# Patient Record
Sex: Female | Born: 1990 | Race: Black or African American | Hispanic: No | Marital: Single | Smoking: Never smoker
Health system: Southern US, Community
[De-identification: ages and names within clinical notes are randomized; demographics above are authoritative.]

---

## 2015-10-15 ENCOUNTER — Emergency Department (HOSPITAL_BASED_OUTPATIENT_CLINIC_OR_DEPARTMENT_OTHER)
Admission: EM | Admit: 2015-10-15 | Discharge: 2015-10-15 | Disposition: A | Discharge status: Home / Self Care | Payer: Self-pay | Attending: Emergency Medicine | Admitting: Emergency Medicine

## 2015-10-15 ENCOUNTER — Encounter (HOSPITAL_BASED_OUTPATIENT_CLINIC_OR_DEPARTMENT_OTHER): Payer: Self-pay | Admitting: *Deleted

## 2015-10-15 DIAGNOSIS — N760 Acute vaginitis: Secondary | ICD-10-CM | POA: Insufficient documentation

## 2015-10-15 DIAGNOSIS — B9689 Other specified bacterial agents as the cause of diseases classified elsewhere: Secondary | ICD-10-CM

## 2015-10-15 DIAGNOSIS — Z3202 Encounter for pregnancy test, result negative: Secondary | ICD-10-CM | POA: Insufficient documentation

## 2015-10-15 LAB — WET PREP, GENITAL
Sperm: NONE SEEN
Trich, Wet Prep: NONE SEEN
Yeast Wet Prep HPF POC: NONE SEEN

## 2015-10-15 LAB — URINALYSIS, ROUTINE W REFLEX MICROSCOPIC
Bilirubin Urine: NEGATIVE
GLUCOSE, UA: NEGATIVE mg/dL
HGB URINE DIPSTICK: NEGATIVE
KETONES UR: NEGATIVE mg/dL
LEUKOCYTES UA: NEGATIVE
Nitrite: NEGATIVE
PROTEIN: NEGATIVE mg/dL
Specific Gravity, Urine: 1.024 (ref 1.005–1.030)
pH: 7 (ref 5.0–8.0)

## 2015-10-15 LAB — PREGNANCY, URINE: PREG TEST UR: NEGATIVE

## 2015-10-15 MED ORDER — METRONIDAZOLE 500 MG PO TABS: 500.0000 mg | ORAL_TABLET | Freq: Two times a day (BID) | ORAL | Status: DC

## 2015-10-15 NOTE — ED Notes (Signed)
Pt reports vaginal discharge and lower abdominal pain since wednesday

## 2015-10-15 NOTE — Discharge Instructions (Signed)

## 2015-10-15 NOTE — ED Provider Notes (Signed)
CSN: [id number]     Arrival date & time 10/15/15  1255 History   First MD Initiated Contact with Patient 10/15/15 1357     Chief Complaint  Patient presents with  . Vaginal Discharge     (Consider location/radiation/quality/duration/timing/severity/associated sxs/prior Treatment) HPI   25 year old female presents complaining of abdominal pain and vaginal discharge. Patient reports for the past 4 days she has noticed stronger vaginal odor and discharge with some mild low abdominal cramping. Pain is primarily to the left side of her abdomen described as a crampy and occasional sharp sensation mild to moderate in severity. She denies having any associated fever, back pain, dysuria, hematuria, vaginal bleeding or rash. Denies any pain with sexual activities, denies any recent new sexual partners and no prior history of STD but she did have a remote history of bacterial vaginosis.   History reviewed. No pertinent past medical history. History reviewed. No pertinent past surgical history. No family history on file. Social History  Substance Use Topics  . Smoking status: Never Smoker   . Smokeless tobacco: Never Used  . Alcohol Use: No   OB History    No data available     Review of Systems  Constitutional: Negative for fever.  Genitourinary: Positive for vaginal discharge. Negative for flank pain and vaginal bleeding.  All other systems reviewed and are negative.     Allergies  Review of patient's allergies indicates no known allergies.  Home Medications   Prior to Admission medications   Not on File   BP 112/73 mmHg  Pulse 66  Temp(Src) 98.2 F (36.8 C) (Oral)  Resp 18  Ht  (1.727 m)  Wt 77.021 kg  BMI 25.82 kg/m2  SpO2 100%  LMP 09/30/2015 Physical Exam  Constitutional: She appears well-developed and well-nourished. No distress.  HENT:  Head: Atraumatic.  Eyes: Conjunctivae are normal.  Neck: Neck supple.  Cardiovascular: Normal rate and regular rhythm.    Pulmonary/Chest: Effort normal and breath sounds normal.  Abdominal: Soft. Bowel sounds are normal. She exhibits no distension. There is no tenderness.  Genitourinary:  Chaperone present during exam.  no inguinal lymphadenopathy or inguinal hernia noted. Normal external genitalia. No pain with speculum insertion. moderate amount of white discharge noted in vaginal vault. Close cervical os with mild dystrophic skin changes to the T-zone. On bimanual examination patient has no adnexal tenderness or cervical motion tenderness.   Neurological: She is alert.  Skin: No rash noted.  Psychiatric: She has a normal mood and affect.  Nursing note and vitals reviewed.   ED Course  Procedures (including critical care time) Labs Review Labs Reviewed  WET PREP, GENITAL - Abnormal; Notable for the following:    Clue Cells Wet Prep HPF POC PRESENT (*)    WBC, Wet Prep HPF POC MANY (*)    All other components within normal limits  URINALYSIS, ROUTINE W REFLEX MICROSCOPIC (NOT AT [facility]) - Abnormal; Notable for the following:    APPearance CLOUDY (*)    All other components within normal limits  PREGNANCY, URINE  RPR  HIV ANTIBODY (ROUTINE TESTING)  GC/CHLAMYDIA PROBE AMP () NOT AT [facility]    Imaging Review No results found.  I have personally reviewed and evaluated these images and lab results as part of my medical decision-making.   EKG Interpretation None      MDM   Final diagnoses:  BV (bacterial vaginosis)    BP 107/66 mmHg  Pulse 68  Temp(Src) 98.2 F (36.8 C) (  Oral)  Resp 16  Ht  (1.727 m)  Wt 77.021 kg  BMI 25.82 kg/m2  SpO2 98%  LMP 09/30/2015   2:59 PM patient here with complaints of vaginal discharge, prior history of bacterial vaginosis. She does not have any pain on pelvic examination to suggest PID. Her urine pregnancy test is negative and her urine shows no signs of urinary tract infection.  3:58 PM Wet prep shows moderate clue cells as well as maybe  WBC. Patient does have strong odor with vaginal discharge consistent with bacterial vaginosis. Her pregnancy test is negative. Her urine shows no signs of urinary tract infection. She will be treated for BV with Flagyl. I recommend avoid alcohol use while taking antibiotic as it can cause an adverse reaction.  Fayrene Helper, PA-C 10/15/15 1559  Glynn Octave, MD 10/15/15 [phone]

## 2015-10-16 LAB — HIV ANTIBODY (ROUTINE TESTING W REFLEX): HIV Screen 4th Generation wRfx: NONREACTIVE

## 2015-10-16 LAB — RPR: RPR Ser Ql: NONREACTIVE

## 2015-10-17 LAB — GC/CHLAMYDIA PROBE AMP (~~LOC~~) NOT AT ARMC
Chlamydia: NEGATIVE
NEISSERIA GONORRHEA: NEGATIVE

## 2017-07-07 ENCOUNTER — Encounter (HOSPITAL_BASED_OUTPATIENT_CLINIC_OR_DEPARTMENT_OTHER): Payer: Self-pay | Admitting: Emergency Medicine

## 2017-07-07 ENCOUNTER — Emergency Department (HOSPITAL_BASED_OUTPATIENT_CLINIC_OR_DEPARTMENT_OTHER)
Admission: EM | Admit: 2017-07-07 | Discharge: 2017-07-07 | Disposition: A | Discharge status: Home / Self Care | Payer: Self-pay | Attending: Emergency Medicine | Admitting: Emergency Medicine

## 2017-07-07 ENCOUNTER — Emergency Department (HOSPITAL_BASED_OUTPATIENT_CLINIC_OR_DEPARTMENT_OTHER): Payer: Self-pay

## 2017-07-07 DIAGNOSIS — R1032 Left lower quadrant pain: Secondary | ICD-10-CM | POA: Insufficient documentation

## 2017-07-07 DIAGNOSIS — N3000 Acute cystitis without hematuria: Secondary | ICD-10-CM | POA: Insufficient documentation

## 2017-07-07 LAB — URINALYSIS, ROUTINE W REFLEX MICROSCOPIC
BILIRUBIN URINE: NEGATIVE
Glucose, UA: NEGATIVE mg/dL
Ketones, ur: NEGATIVE mg/dL
Nitrite: NEGATIVE
PROTEIN: NEGATIVE mg/dL
Specific Gravity, Urine: 1.015 (ref 1.005–1.030)
pH: 8 (ref 5.0–8.0)

## 2017-07-07 LAB — WET PREP, GENITAL
Clue Cells Wet Prep HPF POC: NONE SEEN
SPERM: NONE SEEN
Trich, Wet Prep: NONE SEEN
Yeast Wet Prep HPF POC: NONE SEEN

## 2017-07-07 LAB — PREGNANCY, URINE: PREG TEST UR: NEGATIVE

## 2017-07-07 LAB — URINALYSIS, MICROSCOPIC (REFLEX)

## 2017-07-07 MED ORDER — CEPHALEXIN 500 MG PO CAPS: 500.0000 mg | ORAL_CAPSULE | Freq: Two times a day (BID) | ORAL | 0 refills | Status: AC

## 2017-07-07 NOTE — ED Notes (Signed)
Pt to US at this time.

## 2017-07-07 NOTE — ED Notes (Signed)
Pt questioning about results. Questioning if it's necessary to have US performed. Will make MD aware.

## 2017-07-07 NOTE — ED Provider Notes (Addendum)
[facility] EMERGENCY DEPARTMENT Provider Note   CSN: [id number] Arrival date & time: 07/07/17  1355     History   Chief Complaint Chief Complaint  Patient presents with  . Vaginal Discharge    HPI Tonya Hicks is a 26 y.o. female.  The history is provided by the patient.  Vaginal Discharge   This is a new problem. Episode onset: 4 days ago. The problem occurs constantly. The problem has not changed since onset.The discharge occurs spontaneously. The discharge was white. She is not pregnant. She has not missed her period. Associated symptoms include abdominal pain and dysuria. Pertinent negatives include no anorexia, no fever, no diarrhea, no nausea, no vomiting, no genital itching, no genital lesions and no perineal pain. Associated symptoms comments: Also for the last 4 days she has had progressively worsening left pelvic pain.  She states that slightly worse with urinating but nothing else seems to make it worse such as movement, eating or bowel movements.  Patient has been sexually active with the same partner for greater than 6 months.  This partner is asymptomatic.Marland Kitchen. She has tried nothing for the symptoms. The treatment provided no relief. Her past medical history is significant for ovarian cysts.    History reviewed. No pertinent past medical history.  There are no active problems to display for this patient.   History reviewed. No pertinent surgical history.  OB History    No data available       Home Medications    Prior to Admission medications   Medication Sig Start Date End Date Taking? Authorizing Provider  metroNIDAZOLE (FLAGYL) 500 MG tablet Take 1 tablet (500 mg total) by mouth 2 (two) times daily. One po bid x 7 days 10/15/15   Fayrene Helperran, [place], PA-C    Family History No family history on file.  Social History Social History  Substance Use Topics  . Smoking status: Never Smoker  . Smokeless tobacco: Never Used  . Alcohol use No      Allergies   Patient has no known allergies.   Review of Systems Review of Systems  Constitutional: Negative for fever.  Gastrointestinal: Positive for abdominal pain. Negative for anorexia, diarrhea, nausea and vomiting.  Genitourinary: Positive for dysuria and vaginal discharge.  All other systems reviewed and are negative.    Physical Exam Updated Vital Signs BP 116/69 (BP Location: Left Arm)   Pulse 80   Temp 99 F (37.2 C) (Oral)   Resp 16   Ht 5\' 8"  (1.727 m)   Wt 81.6 kg (180 lb)   LMP 06/24/2017   SpO2 100%   BMI 27.37 kg/m   Physical Exam  Constitutional: She is oriented to person, place, and time. She appears well-developed and well-nourished. No distress.  HENT:  Head: Normocephalic and atraumatic.  Mouth/Throat: Oropharynx is clear and moist.  Eyes: Pupils are equal, round, and reactive to light. Conjunctivae and EOM are normal.  Neck: Normal range of motion. Neck supple.  Cardiovascular: Normal rate, regular rhythm and intact distal pulses.   No murmur heard. Pulmonary/Chest: Effort normal and breath sounds normal. No respiratory distress. She has no wheezes. She has no rales.  Abdominal: Soft. She exhibits no distension. There is tenderness in the left lower quadrant. There is no rebound and no guarding.    Left pelvic tenderness  Genitourinary: Uterus normal. Cervix exhibits discharge. Cervix exhibits no motion tenderness and no friability. Right adnexum displays no mass, no tenderness and no fullness. Left adnexum displays tenderness.  Left adnexum displays no mass and no fullness. No tenderness in the vagina. Vaginal discharge found.  Musculoskeletal: Normal range of motion. She exhibits no edema or tenderness.  Neurological: She is alert and oriented to person, place, and time.  Skin: Skin is warm and dry. No rash noted. No erythema.  Psychiatric: She has a normal mood and affect. Her behavior is normal.  Nursing note and vitals  reviewed.    ED Treatments / Results  Labs (all labs ordered are listed, but only abnormal results are displayed) Labs Reviewed  WET PREP, GENITAL - Abnormal; Notable for the following:       Result Value   WBC, Wet Prep HPF POC MODERATE (*)    All other components within normal limits  URINALYSIS, ROUTINE W REFLEX MICROSCOPIC - Abnormal; Notable for the following:    Hgb urine dipstick TRACE (*)    Leukocytes, UA SMALL (*)    All other components within normal limits  URINALYSIS, MICROSCOPIC (REFLEX) - Abnormal; Notable for the following:    Bacteria, UA MANY (*)    Squamous Epithelial / LPF 0-5 (*)    All other components within normal limits  PREGNANCY, URINE  GC/CHLAMYDIA PROBE AMP ([place]) NOT AT [facility]    EKG  EKG Interpretation None       Radiology US Transvaginal Non-ob  Result Date: 07/07/2017 CLINICAL DATA:  Left lower quadrant pain EXAM: TRANSABDOMINAL AND TRANSVAGINAL ULTRASOUND OF PELVIS DOPPLER ULTRASOUND OF OVARIES TECHNIQUE: Both transabdominal and transvaginal ultrasound examinations of the pelvis were performed. Transabdominal technique was performed for global imaging of the pelvis including uterus, ovaries, adnexal regions, and pelvic cul-de-sac. It was necessary to proceed with endovaginal exam following the transabdominal exam to visualize the endometrium and ovaries and uterus. Color and duplex Doppler ultrasound was utilized to evaluate blood flow to the ovaries. COMPARISON:  None. FINDINGS: Uterus Measurements: 6.1 x 3.2 x 3.7 cm. Trace fluid in the cervical canal. Endometrium Thickness: 9 mm.  No focal abnormality visualized. Right ovary Measurements: 3.3 x 2.4 x 2.2 cm. Prominent follicles/ cysts in the right ovary measuring up to 2 cm and 1.5 cm. Left ovary Measurements: 2.7 x 1.6 x 2.1 cm. Normal appearance/no adnexal mass. Pulsed Doppler evaluation of both ovaries demonstrates normal low-resistance arterial and venous waveforms. Other findings Trace  free fluid IMPRESSION: 1. Negative for ovarian torsion or ovarian mass lesion 2. Trace free fluid in the pelvis Electronically Signed   By: Jasmine Pang M.D.   On: 07/07/2017 17:24   US Pelvis Complete  Result Date: 07/07/2017 CLINICAL DATA:  Left lower quadrant pain EXAM: TRANSABDOMINAL AND TRANSVAGINAL ULTRASOUND OF PELVIS DOPPLER ULTRASOUND OF OVARIES TECHNIQUE: Both transabdominal and transvaginal ultrasound examinations of the pelvis were performed. Transabdominal technique was performed for global imaging of the pelvis including uterus, ovaries, adnexal regions, and pelvic cul-de-sac. It was necessary to proceed with endovaginal exam following the transabdominal exam to visualize the endometrium and ovaries and uterus. Color and duplex Doppler ultrasound was utilized to evaluate blood flow to the ovaries. COMPARISON:  None. FINDINGS: Uterus Measurements: 6.1 x 3.2 x 3.7 cm. Trace fluid in the cervical canal. Endometrium Thickness: 9 mm.  No focal abnormality visualized. Right ovary Measurements: 3.3 x 2.4 x 2.2 cm. Prominent follicles/ cysts in the right ovary measuring up to 2 cm and 1.5 cm. Left ovary Measurements: 2.7 x 1.6 x 2.1 cm. Normal appearance/no adnexal mass. Pulsed Doppler evaluation of both ovaries demonstrates normal low-resistance arterial and venous waveforms. Other  findings Trace free fluid IMPRESSION: 1. Negative for ovarian torsion or ovarian mass lesion 2. Trace free fluid in the pelvis Electronically Signed   By: Jasmine Pang M.D.   On: 07/07/2017 17:24   [place] Art/ven Flow Abd Pelv Doppler  Result Date: 07/07/2017 CLINICAL DATA:  Left lower quadrant pain EXAM: TRANSABDOMINAL AND TRANSVAGINAL ULTRASOUND OF PELVIS DOPPLER ULTRASOUND OF OVARIES TECHNIQUE: Both transabdominal and transvaginal ultrasound examinations of the pelvis were performed. Transabdominal technique was performed for global imaging of the pelvis including uterus, ovaries, adnexal regions, and pelvic cul-de-sac.  It was necessary to proceed with endovaginal exam following the transabdominal exam to visualize the endometrium and ovaries and uterus. Color and duplex Doppler ultrasound was utilized to evaluate blood flow to the ovaries. COMPARISON:  None. FINDINGS: Uterus Measurements: 6.1 x 3.2 x 3.7 cm. Trace fluid in the cervical canal. Endometrium Thickness: 9 mm.  No focal abnormality visualized. Right ovary Measurements: 3.3 x 2.4 x 2.2 cm. Prominent follicles/ cysts in the right ovary measuring up to 2 cm and 1.5 cm. Left ovary Measurements: 2.7 x 1.6 x 2.1 cm. Normal appearance/no adnexal mass. Pulsed Doppler evaluation of both ovaries demonstrates normal low-resistance arterial and venous waveforms. Other findings Trace free fluid IMPRESSION: 1. Negative for ovarian torsion or ovarian mass lesion 2. Trace free fluid in the pelvis Electronically Signed   By: Jasmine Pang M.D.   On: 07/07/2017 17:24    Procedures Procedures (including critical care time)  Medications Ordered in ED Medications - No data to display   Initial Impression / Assessment and Plan / ED Course  I have reviewed the triage vital signs and the nursing notes.  Pertinent labs & imaging results that were available during my care of the patient were reviewed by me and considered in my medical decision making (see chart for details).     Patient presenting with 4 days of progressively worsening left pelvic tenderness.  She describes the pain as sharp and stabbing and comes intermittently but in the last day has been constant.  She has had some mild vaginal discharge which she states is similar to when she has had bacterial vaginosis.  She denies any new sexual contacts and has a low risk sexual behavior.  Urine pregnancy test is negative.  UA could be questionable for UTI however with adnexal tenderness concern for ovarian cyst.  GC and chlamydia are pending.  Wet prep pending  5:38 PM Wet prep and pelvic u/s unremarkable.  Will d/c  home with keflex.  Final Clinical Impressions(s) / ED Diagnoses   Final diagnoses:  Acute cystitis without hematuria    New Prescriptions New Prescriptions   CEPHALEXIN (KEFLEX) 500 MG CAPSULE    Take 1 capsule (500 mg total) by mouth 2 (two) times daily.     Gwyneth Sprout, MD 07/07/17 1739    Gwyneth Sprout, MD 07/07/17 1740

## 2017-07-07 NOTE — ED Triage Notes (Signed)
Vaginal discharge, dysuria, and LLQ abd pain since Friday.

## 2017-07-07 NOTE — ED Notes (Signed)
ED Provider at bedside. 

## 2017-07-08 LAB — GC/CHLAMYDIA PROBE AMP (~~LOC~~) NOT AT ARMC
Chlamydia: NEGATIVE
Neisseria Gonorrhea: NEGATIVE

## 2018-02-09 ENCOUNTER — Emergency Department (HOSPITAL_BASED_OUTPATIENT_CLINIC_OR_DEPARTMENT_OTHER)
Admission: EM | Admit: 2018-02-09 | Discharge: 2018-02-10 | Disposition: A | Discharge status: Home / Self Care | Payer: Medicaid Other | Attending: Emergency Medicine | Admitting: Emergency Medicine

## 2018-02-09 ENCOUNTER — Other Ambulatory Visit: Payer: Self-pay

## 2018-02-09 ENCOUNTER — Encounter (HOSPITAL_BASED_OUTPATIENT_CLINIC_OR_DEPARTMENT_OTHER): Payer: Self-pay | Admitting: Emergency Medicine

## 2018-02-09 DIAGNOSIS — O211 Hyperemesis gravidarum with metabolic disturbance: Secondary | ICD-10-CM | POA: Diagnosis not present

## 2018-02-09 DIAGNOSIS — Z3A00 Weeks of gestation of pregnancy not specified: Secondary | ICD-10-CM | POA: Diagnosis not present

## 2018-02-09 DIAGNOSIS — Z79899 Other long term (current) drug therapy: Secondary | ICD-10-CM | POA: Diagnosis not present

## 2018-02-09 DIAGNOSIS — R112 Nausea with vomiting, unspecified: Secondary | ICD-10-CM | POA: Diagnosis present

## 2018-02-09 DIAGNOSIS — O210 Mild hyperemesis gravidarum: Secondary | ICD-10-CM

## 2018-02-09 LAB — URINALYSIS, ROUTINE W REFLEX MICROSCOPIC
Glucose, UA: NEGATIVE mg/dL
Ketones, ur: >80 mg/dL — AB
NITRITE: NEGATIVE
PROTEIN: 30 mg/dL — AB
Specific Gravity, Urine: >1.03 — ABNORMAL HIGH (ref 1.005–1.030)
pH: 6 (ref 5.0–8.0)

## 2018-02-09 LAB — URINALYSIS, MICROSCOPIC (REFLEX)

## 2018-02-09 LAB — PREGNANCY, URINE: Preg Test, Ur: POSITIVE — AB

## 2018-02-09 MED ORDER — SODIUM CHLORIDE 0.9 % IV BOLUS
1000.0000 mL | Freq: Once | INTRAVENOUS | Status: AC
  Administered 2018-02-09: 1000 mL via INTRAVENOUS

## 2018-02-09 MED ORDER — METOCLOPRAMIDE HCL 10 MG PO TABS: 10.0000 mg | ORAL_TABLET | Freq: Four times a day (QID) | ORAL | 0 refills | Status: AC | PRN

## 2018-02-09 MED ORDER — METOCLOPRAMIDE HCL 5 MG/ML IJ SOLN
10.0000 mg | Freq: Once | INTRAMUSCULAR | Status: AC
  Administered 2018-02-09: 10 mg via INTRAVENOUS
  Filled 2018-02-09: qty 2

## 2018-02-09 NOTE — ED Notes (Signed)
Alert, NAD, calm, interactive, resps e/u, speaking in clear complete sentences, no dyspnea noted, skin W&D, VSS, c/o "just weak and nauseated, can't keep anything down, had a + home pregnancy test", also reports urinary frequency, (denies: pain, sob, dizziness, urinary urgency, dysuria, hematuria, back pain, vaginal sx, fever, or visual changes). [facility]

## 2018-02-09 NOTE — ED Notes (Signed)
No changes, alert, NAD, calm, interactive, no emesis/ vomiting at this time. VSS. [facility]

## 2018-02-09 NOTE — ED Notes (Signed)
Alert, NAD, calm, interactive, no changes, "feel a Rote better", attempting sips of water, [facility], [facility].

## 2018-02-09 NOTE — ED Notes (Signed)
Patient is aware of the urine that needs to be collected. RN is starting IV and giving fluids. Patient will try to give another urine soon.

## 2018-02-09 NOTE — ED Triage Notes (Signed)
Patient states that she has had hot flashed with vomiting since Thursday - Patient took home pregnancy test and it was positive

## 2018-02-09 NOTE — ED Notes (Signed)
Patient asked to stop drinking water while in triage and to hold off drinking anything because of the vomiting. THe patient then continues to keep drinking until she is finished with the bottle of water she has

## 2018-02-09 NOTE — ED Provider Notes (Signed)
[facility] EMERGENCY DEPARTMENT Provider Note   CSN: [id number] Arrival date & time: 02/09/18  1922     History   Chief Complaint Chief Complaint  Patient presents with  . Emesis    HPI Tonya Hicks is a 27 y.o. female.  Patient is a 27 year old female who presents with nausea and vomiting.  She recently took a pregnancy test that was positive.  Over the last several days she has had ongoing nausea and vomiting has not been able to keep anything down.  She states she has not been able to keep down anything including water although reportedly she was drinking a bottle of water in the waiting room without vomiting.  She denies any abdominal pain.  No fevers.  No urinary symptoms.  Her last menstrual cycle was in mid April but she does not know the exact date.  She has not yet established care with an OB/GYN.  She denies any vaginal bleeding or discharge.     History reviewed. No pertinent past medical history.  There are no active problems to display for this patient.   History reviewed. No pertinent surgical history.   OB History   None      Home Medications    Prior to Admission medications   Medication Sig Start Date End Date Taking? Authorizing Provider  cephALEXin (KEFLEX) 500 MG capsule Take 1 capsule (500 mg total) by mouth 2 (two) times daily. 07/07/17   Gwyneth Sprout, MD  metoCLOPramide (REGLAN) 10 MG tablet Take 1 tablet (10 mg total) by mouth every 6 (six) hours as needed for nausea (nausea/headache). 02/09/18   Rolan Bucco, MD  metroNIDAZOLE (FLAGYL) 500 MG tablet Take 1 tablet (500 mg total) by mouth 2 (two) times daily. One po bid x 7 days 10/15/15   Fayrene Helper, PA-C    Family History History reviewed. No pertinent family history.  Social History Social History   Tobacco Use  . Smoking status: Never Smoker  . Smokeless tobacco: Never Used  Substance Use Topics  . Alcohol use: No  . Drug use: No     Allergies   Patient has no  known allergies.   Review of Systems Review of Systems  Constitutional: Negative for chills, diaphoresis, fatigue and fever.  HENT: Negative for congestion, rhinorrhea and sneezing.   Eyes: Negative.   Respiratory: Negative for cough, chest tightness and shortness of breath.   Cardiovascular: Negative for chest pain and leg swelling.  Gastrointestinal: Positive for nausea and vomiting. Negative for abdominal pain, blood in stool and diarrhea.  Genitourinary: Negative for difficulty urinating, flank pain, frequency and hematuria.  Musculoskeletal: Negative for arthralgias and back pain.  Skin: Negative for rash.  Neurological: Negative for dizziness, speech difficulty, weakness, numbness and headaches.     Physical Exam Updated Vital Signs BP 106/64   Pulse 67   Temp 98.3 F (36.8 C) (Oral)   Resp 16   Ht 5\' 8"  (1.727 m)   Wt 80.7 kg (178 lb)   LMP  (LMP Unknown)   SpO2 100%   BMI 27.06 kg/m   Physical Exam  Constitutional: She is oriented to person, place, and time. She appears well-developed and well-nourished.  HENT:  Head: Normocephalic and atraumatic.  Eyes: Pupils are equal, round, and reactive to light.  Neck: Normal range of motion. Neck supple.  Cardiovascular: Normal rate, regular rhythm and normal heart sounds.  Pulmonary/Chest: Effort normal and breath sounds normal. No respiratory distress. She has no wheezes. She has  no rales. She exhibits no tenderness.  Abdominal: Soft. Bowel sounds are normal. There is no tenderness. There is no rebound and no guarding.  Musculoskeletal: Normal range of motion. She exhibits no edema.  Lymphadenopathy:    She has no cervical adenopathy.  Neurological: She is alert and oriented to person, place, and time.  Skin: Skin is warm and dry. No rash noted.  Psychiatric: She has a normal mood and affect.     ED Treatments / Results  Labs (all labs ordered are listed, but only abnormal results are displayed) Labs Reviewed    URINALYSIS, ROUTINE W REFLEX MICROSCOPIC - Abnormal; Notable for the following components:      Result Value   APPearance CLOUDY (*)    Specific Gravity, Urine >1.030 (*)    Hgb urine dipstick SMALL (*)    Bilirubin Urine SMALL (*)    Ketones, ur >80 (*)    Protein, ur 30 (*)    Leukocytes, UA SMALL (*)    All other components within normal limits  PREGNANCY, URINE - Abnormal; Notable for the following components:   Preg Test, Ur POSITIVE (*)    All other components within normal limits  URINALYSIS, MICROSCOPIC (REFLEX) - Abnormal; Notable for the following components:   Bacteria, UA MANY (*)    All other components within normal limits  URINALYSIS, ROUTINE W REFLEX MICROSCOPIC - Abnormal; Notable for the following components:   Ketones, ur >80 (*)    All other components within normal limits    EKG None  Radiology No results found.  Procedures Procedures (including critical care time)  Medications Ordered in ED Medications  sodium chloride 0.9 % bolus 1,000 mL (0 mLs Intravenous Stopped 02/09/18 2336)  metoCLOPramide (REGLAN) injection 10 mg (10 mg Intravenous Given 02/09/18 2246)     Initial Impression / Assessment and Plan / ED Course  I have reviewed the triage vital signs and the nursing notes.  Pertinent labs & imaging results that were available during my care of the patient were reviewed by me and considered in my medical decision making (see chart for details).     Pt is a 27 year old female who presents with nausea vomiting.  She recently had a positive pregnancy test.  She has no associated abdominal pain on exam.  She has no vaginal bleeding or discharge.  Her pregnancy test is positive.  Her last menstrual period was in mid April.  Her urinalysis initially showed a dirty specimen.  She does report some urinary frequency but no other symptoms of a UTI.  She was given IV fluids and Reglan for nausea.  She is feeling better after this and is able to tolerate oral  fluids.  Her initial urinalysis was grossly contaminated.  A repeat urine was done which appears to be normal without signs of infection.  There was some ketones but she was given IV fluids and she is drinking now.  She was discharged home in good condition.  She was given a referral to follow-up with the women's outpatient clinic.  She was encouraged to establish prenatal care and to start taking a prenatal vitamin.  She was given a prescription for Reglan.  Return precautions were given.  Final Clinical Impressions(s) / ED Diagnoses   Final diagnoses:  Hyperemesis gravidarum    ED Discharge Orders        Ordered    metoCLOPramide (REGLAN) 10 MG tablet  Every 6 hours PRN     02/09/18 2348  Rolan BuccoBelfi, Malakai Schoenherr, MD 02/10/18 [phone]

## 2018-02-09 NOTE — Discharge Instructions (Signed)
Start taking a prenatal vitamin which you can buy over-the-counter at a drugstore.  Make an appointment to follow-up with an OB/GYN to begin prenatal care.  Eat small frequent meals throughout the day.  Return if you have any worsening symptoms.

## 2018-02-10 LAB — URINALYSIS, ROUTINE W REFLEX MICROSCOPIC
BILIRUBIN URINE: NEGATIVE
GLUCOSE, UA: NEGATIVE mg/dL
Hgb urine dipstick: NEGATIVE
LEUKOCYTES UA: NEGATIVE
Nitrite: NEGATIVE
PH: 6 (ref 5.0–8.0)
Protein, ur: NEGATIVE mg/dL
Specific Gravity, Urine: 1.025 (ref 1.005–1.030)

## 2018-02-10 NOTE — ED Notes (Signed)
Pt discharged to home with family. NAD.  

## 2018-08-03 IMAGING — US US PELVIS COMPLETE
1 series · 13 of 25 positions shown · non-contrast
Comparison: None.

CLINICAL DATA: Left lower quadrant pain

EXAM:
TRANSABDOMINAL AND TRANSVAGINAL ULTRASOUND OF PELVIS
DOPPLER ULTRASOUND OF OVARIES
TECHNIQUE: Both transabdominal and transvaginal ultrasound examinations of the
pelvis were performed. Transabdominal technique was performed for
global imaging of the pelvis including uterus, ovaries, adnexal
regions, and pelvic cul-de-sac.
It was necessary to proceed with endovaginal exam following the
transabdominal exam to visualize the endometrium and ovaries and
uterus. Color and duplex Doppler ultrasound was utilized to evaluate
blood flow to the ovaries.

[Series 1: us pelvis complete · 0.22mm/px · 13 of 58 slices shown]
[im 1/58]
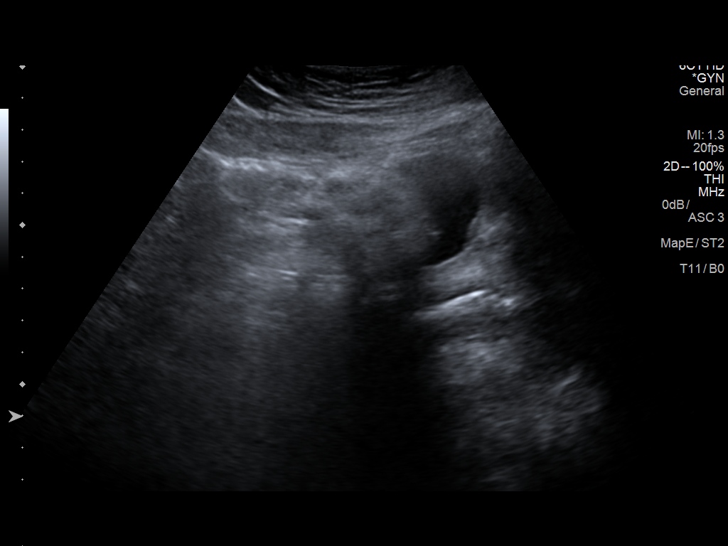
[im 5/58]
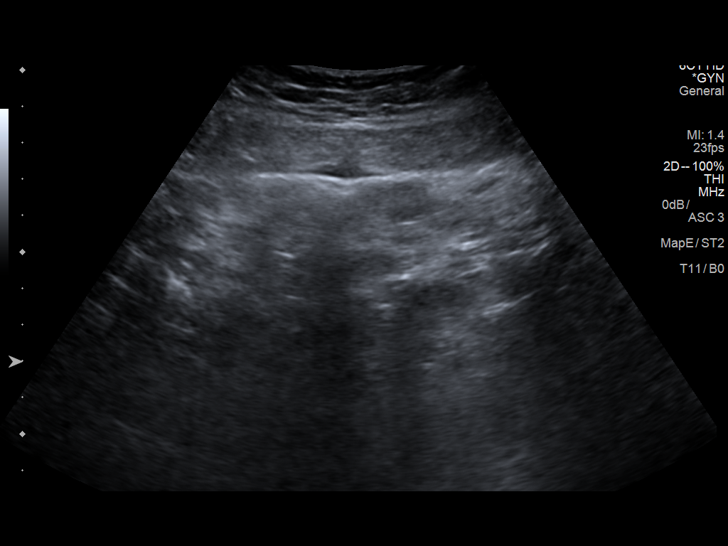
[im 10/58]
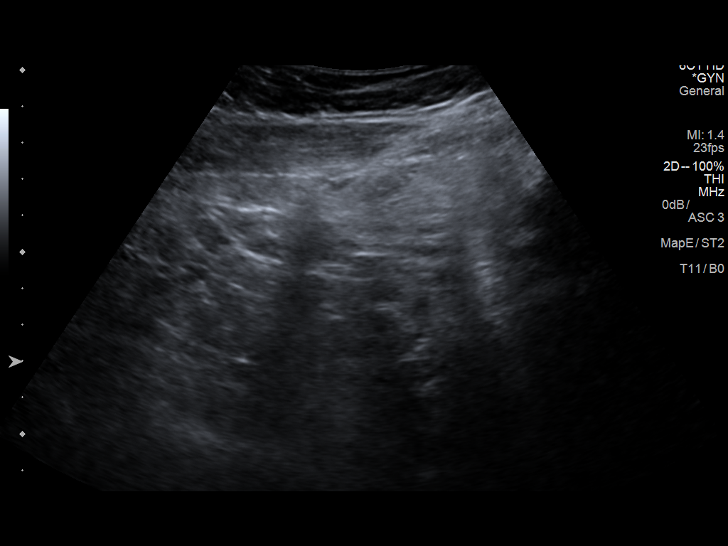
[im 15/58]
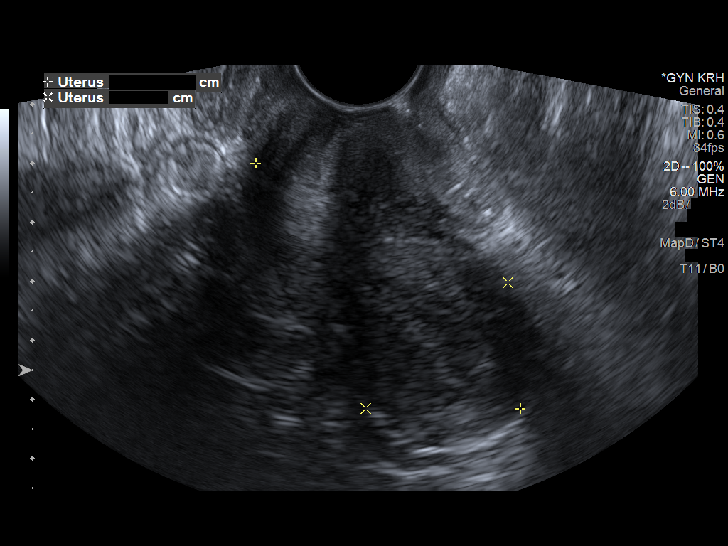
[im 20/58]
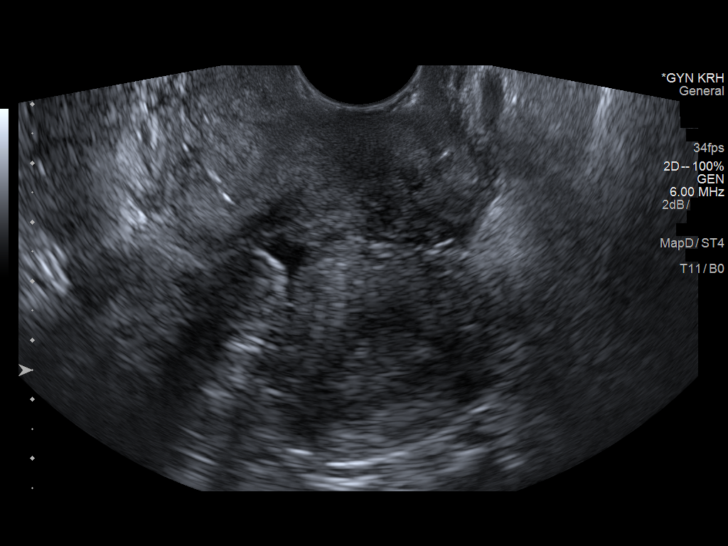
[im 24/58]
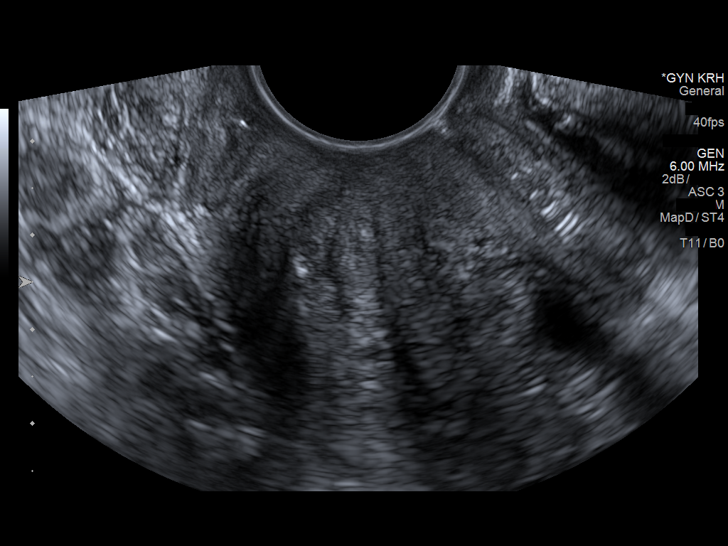
[im 29/58]
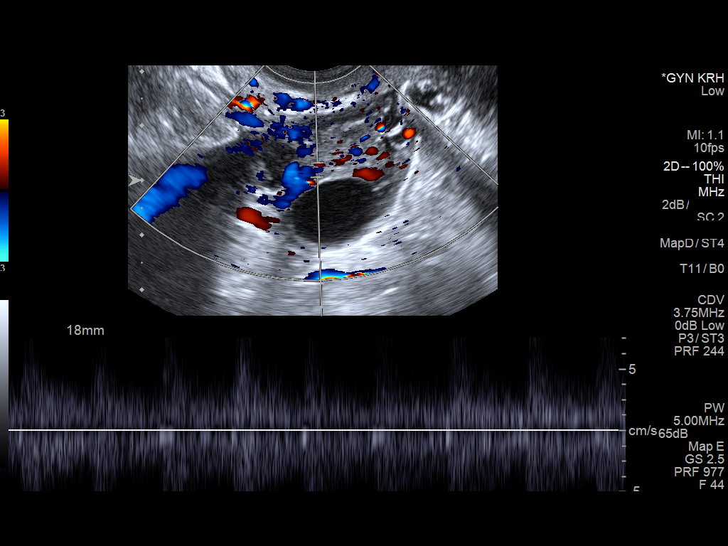
[im 34/58]
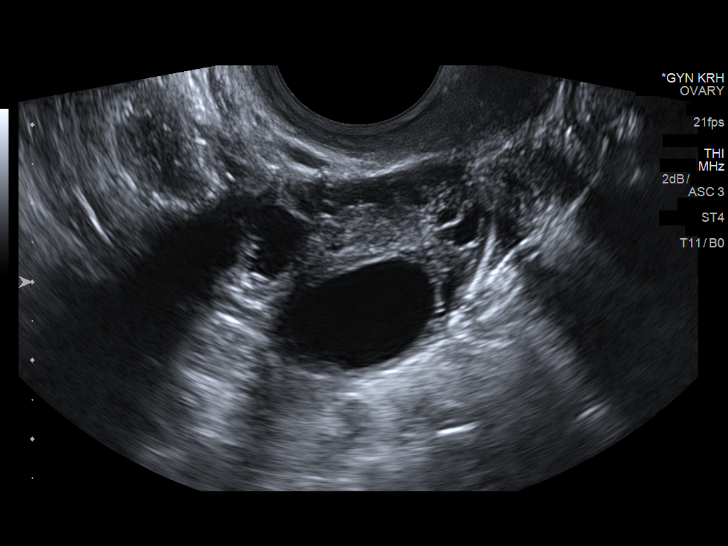
[im 39/58]
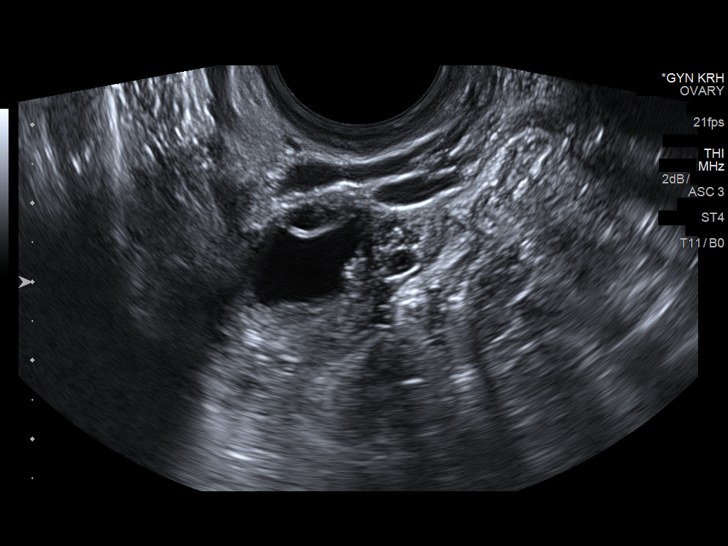
[im 43/58]
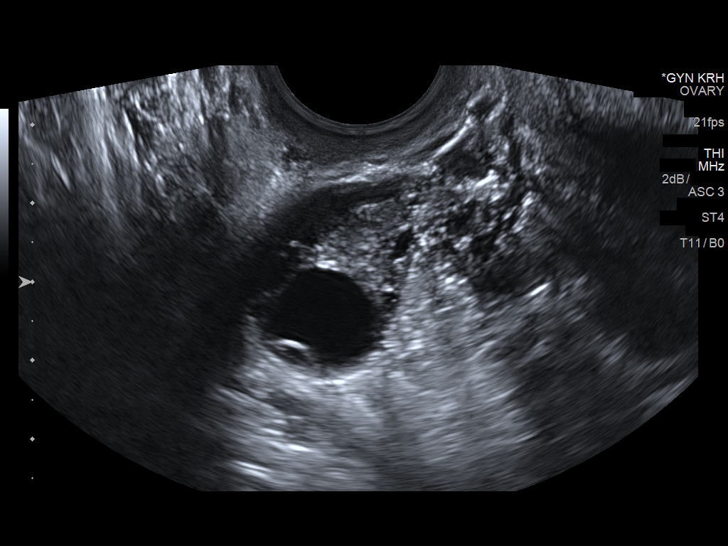
[im 48/58]
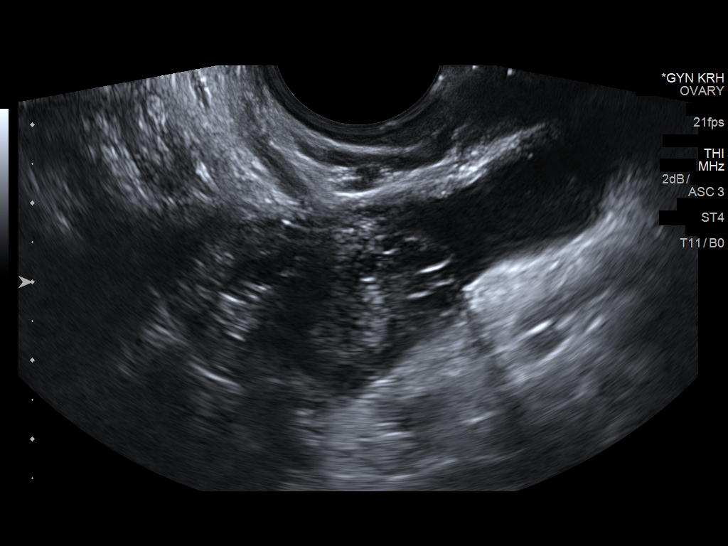
[im 53/58]
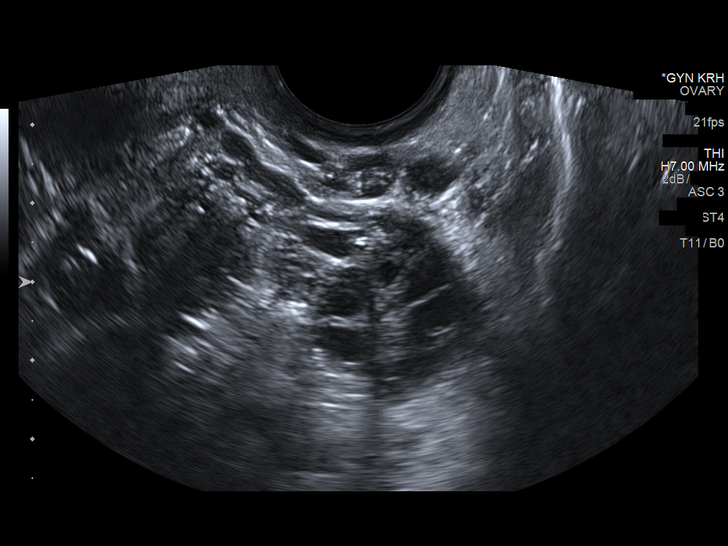
[im 58/58]
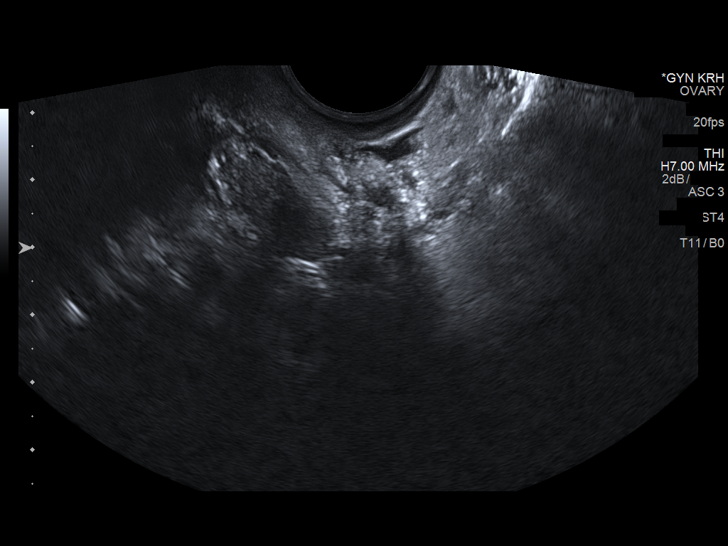

[13 of 25 positions shown; findings below may reference images not displayed]

FINDINGS: Uterus

Measurements: 6.1 x 3.2 x 3.7 cm. Trace fluid in the cervical canal.

Endometrium

Thickness: 9 mm.  No focal abnormality visualized.

Right ovary

Measurements: 3.3 x 2.4 x 2.2 cm. Prominent follicles/ cysts in the
right ovary measuring up to 2 cm and 1.5 cm.

Left ovary

Measurements: 2.7 x 1.6 x 2.1 cm. Normal appearance/no adnexal mass.

Pulsed Doppler evaluation of both ovaries demonstrates normal
low-resistance arterial and venous waveforms.

Other findings

Trace free fluid
IMPRESSION: 1. Negative for ovarian torsion or ovarian mass lesion
2. Trace free fluid in the pelvis

## 2019-07-25 ENCOUNTER — Other Ambulatory Visit: Payer: Self-pay

## 2019-07-25 ENCOUNTER — Emergency Department (HOSPITAL_BASED_OUTPATIENT_CLINIC_OR_DEPARTMENT_OTHER)
Admission: EM | Admit: 2019-07-25 | Discharge: 2019-07-25 | Disposition: A | Discharge status: Home / Self Care | Payer: Medicaid Other | Attending: Emergency Medicine | Admitting: Emergency Medicine

## 2019-07-25 ENCOUNTER — Encounter (HOSPITAL_BASED_OUTPATIENT_CLINIC_OR_DEPARTMENT_OTHER): Payer: Self-pay | Admitting: Emergency Medicine

## 2019-07-25 DIAGNOSIS — O218 Other vomiting complicating pregnancy: Secondary | ICD-10-CM | POA: Insufficient documentation

## 2019-07-25 DIAGNOSIS — O219 Vomiting of pregnancy, unspecified: Secondary | ICD-10-CM | POA: Diagnosis present

## 2019-07-25 DIAGNOSIS — Z3A01 Less than 8 weeks gestation of pregnancy: Secondary | ICD-10-CM | POA: Insufficient documentation

## 2019-07-25 LAB — LIPASE, BLOOD: Lipase: 20 U/L (ref 11–51)

## 2019-07-25 LAB — URINALYSIS, ROUTINE W REFLEX MICROSCOPIC
Glucose, UA: NEGATIVE mg/dL
Hgb urine dipstick: NEGATIVE
Ketones, ur: >80 mg/dL — AB
Leukocytes,Ua: NEGATIVE
Nitrite: NEGATIVE
Protein, ur: 30 mg/dL — AB
Specific Gravity, Urine: >1.03 — ABNORMAL HIGH (ref 1.005–1.030)
pH: 6 (ref 5.0–8.0)

## 2019-07-25 LAB — COMPREHENSIVE METABOLIC PANEL
ALT: 15 U/L (ref 0–44)
AST: 17 U/L (ref 15–41)
Albumin: 4.4 g/dL (ref 3.5–5.0)
Alkaline Phosphatase: 47 U/L (ref 38–126)
Anion gap: 12 (ref 5–15)
BUN: 13 mg/dL (ref 6–20)
CO2: 21 mmol/L — ABNORMAL LOW (ref 22–32)
Calcium: 9.2 mg/dL (ref 8.9–10.3)
Chloride: 102 mmol/L (ref 98–111)
Creatinine, Ser: 0.74 mg/dL (ref 0.44–1.00)
GFR calc Af Amer: >60 mL/min (ref >60)
GFR calc non Af Amer: >60 mL/min (ref >60)
Glucose, Bld: 104 mg/dL — ABNORMAL HIGH (ref 70–99)
Potassium: 3.3 mmol/L — ABNORMAL LOW (ref 3.5–5.1)
Sodium: 135 mmol/L (ref 135–145)
Total Bilirubin: 0.8 mg/dL (ref 0.3–1.2)
Total Protein: 8.7 g/dL — ABNORMAL HIGH (ref 6.5–8.1)

## 2019-07-25 LAB — URINALYSIS, MICROSCOPIC (REFLEX)

## 2019-07-25 LAB — CBC
HCT: 38.8 % (ref 36.0–46.0)
Hemoglobin: 12.5 g/dL (ref 12.0–15.0)
MCH: 22.7 pg — ABNORMAL LOW (ref 26.0–34.0)
MCHC: 32.2 g/dL (ref 30.0–36.0)
MCV: 70.4 fL — ABNORMAL LOW (ref 80.0–100.0)
Platelets: 366 10*3/uL (ref 150–400)
RBC: 5.51 MIL/uL — ABNORMAL HIGH (ref 3.87–5.11)
RDW: 13.4 % (ref 11.5–15.5)
WBC: 11.9 10*3/uL — ABNORMAL HIGH (ref 4.0–10.5)
nRBC: 0 % (ref 0.0–0.2)

## 2019-07-25 LAB — PREGNANCY, URINE: Preg Test, Ur: POSITIVE — AB

## 2019-07-25 MED ORDER — KCL IN DEXTROSE-NACL 20-5-0.45 MEQ/L-%-% IV SOLN
Freq: Once | INTRAVENOUS | Status: AC
  Administered 2019-07-25: 21:00:00 via INTRAVENOUS
  Filled 2019-07-25: qty 1000

## 2019-07-25 MED ORDER — SODIUM CHLORIDE 0.9 % IV BOLUS
1000.0000 mL | Freq: Once | INTRAVENOUS | Status: AC
  Administered 2019-07-25: 1000 mL via INTRAVENOUS

## 2019-07-25 MED ORDER — ONDANSETRON 4 MG PO TBDP: 4.0000 mg | ORAL_TABLET | Freq: Three times a day (TID) | ORAL | 0 refills | Status: AC | PRN

## 2019-07-25 MED ORDER — ONDANSETRON HCL 4 MG/2ML IJ SOLN
4.0000 mg | Freq: Once | INTRAMUSCULAR | Status: AC
  Administered 2019-07-25: 4 mg via INTRAVENOUS
  Filled 2019-07-25: qty 2

## 2019-07-25 NOTE — ED Notes (Signed)
Pt verbalized understanding to pick up Rx at pharmacy listed on d/c paperwork

## 2019-07-25 NOTE — ED Notes (Signed)
ED Provider at bedside. 

## 2019-07-25 NOTE — ED Provider Notes (Signed)
[facility] EMERGENCY DEPARTMENT Provider Note   CSN: [id number] Arrival date & time: 07/25/19  1750     History   Chief Complaint Chief Complaint  Patient presents with  . Emesis    HPI Tonya Hicks is a 28 y.o. female.     HPI Patient presents with concern of nausea, vomiting, weakness. Onset was 3 days ago.  Since that time she has been unable to tolerate any oral intake, including fluids, solids.  She has been unable to take medication for relief either. She mentions abdominal pain, but this is only after several questions about other symptoms, denies any focal, specific pain anywhere. Patient states that she is generally well, has no medical problems, takes no medication regularly. She has been concerned about pregnancy, as her last menstrual period was 1 month ago, and she typically would have begun another one about this time. No fever, she does complain of mild chills. There is associated generalized weakness, without focality. History reviewed. No pertinent past medical history.  There are no active problems to display for this patient.   History reviewed. No pertinent surgical history.   OB History   No obstetric history on file.      Home Medications    Prior to Admission medications   Medication Sig Start Date End Date Taking? Authorizing Provider  cephALEXin (KEFLEX) 500 MG capsule Take 1 capsule (500 mg total) by mouth 2 (two) times daily. 07/07/17   Gwyneth SproutPlunkett, Whitney, MD  metoCLOPramide (REGLAN) 10 MG tablet Take 1 tablet (10 mg total) by mouth every 6 (six) hours as needed for nausea (nausea/headache). 02/09/18   Rolan BuccoBelfi, Melanie, MD  metroNIDAZOLE (FLAGYL) 500 MG tablet Take 1 tablet (500 mg total) by mouth 2 (two) times daily. One po bid x 7 days 10/15/15   Fayrene Helperran, [place], PA-C    Family History History reviewed. No pertinent family history.  Social History Social History   Tobacco Use  . Smoking status: Never Smoker  . Smokeless  tobacco: Never Used  Substance Use Topics  . Alcohol use: Yes  . Drug use: No     Allergies   Patient has no known allergies.   Review of Systems Review of Systems  Constitutional:       Per HPI, otherwise negative  HENT:       Per HPI, otherwise negative  Respiratory:       Per HPI, otherwise negative  Cardiovascular:       Per HPI, otherwise negative  Gastrointestinal: Positive for abdominal pain and vomiting.  Endocrine:       Negative aside from HPI  Genitourinary:       Neg aside from HPI   Musculoskeletal:       Per HPI, otherwise negative  Skin: Negative.   Neurological: Positive for weakness. Negative for syncope.     Physical Exam Updated Vital Signs BP 122/78   Pulse 65   Temp 98.3 F (36.8 C) (Oral)   Resp 16   Ht 5\' 8"  (1.727 m)   Wt 81.2 kg   LMP 06/20/2019 (Approximate)   SpO2 100%   BMI 27.22 kg/m   Physical Exam Vitals signs and nursing note reviewed.  Constitutional:      General: She is not in acute distress.    Appearance: She is well-developed.  HENT:     Head: Normocephalic and atraumatic.  Eyes:     Conjunctiva/sclera: Conjunctivae normal.  Cardiovascular:     Rate and Rhythm: Normal rate and  regular rhythm.  Pulmonary:     Effort: Pulmonary effort is normal. No respiratory distress.     Breath sounds: Normal breath sounds. No stridor.  Abdominal:     General: There is no distension.     Tenderness: There is no abdominal tenderness. There is no guarding.  Skin:    General: Skin is warm and dry.  Neurological:     Mental Status: She is alert and oriented to person, place, and time.     Cranial Nerves: No cranial nerve deficit.      ED Treatments / Results  Labs (all labs ordered are listed, but only abnormal results are displayed) Labs Reviewed  COMPREHENSIVE METABOLIC PANEL - Abnormal; Notable for the following components:      Result Value   Potassium 3.3 (*)    CO2 21 (*)    Glucose, Bld 104 (*)    Total Protein  8.7 (*)    All other components within normal limits  CBC - Abnormal; Notable for the following components:   WBC 11.9 (*)    RBC 5.51 (*)    MCV 70.4 (*)    MCH 22.7 (*)    All other components within normal limits  URINALYSIS, ROUTINE W REFLEX MICROSCOPIC - Abnormal; Notable for the following components:   Color, Urine AMBER (*)    APPearance CLOUDY (*)    Specific Gravity, Urine >1.030 (*)    Bilirubin Urine SMALL (*)    Ketones, ur >80 (*)    Protein, ur 30 (*)    All other components within normal limits  PREGNANCY, URINE - Abnormal; Notable for the following components:   Preg Test, Ur POSITIVE (*)    All other components within normal limits  URINALYSIS, MICROSCOPIC (REFLEX) - Abnormal; Notable for the following components:   Bacteria, UA MANY (*)    All other components within normal limits  LIPASE, BLOOD    EKG None  Radiology No results found.  Procedures Procedures (including critical care time)  Medications Ordered in ED Medications  dextrose 5 % and 0.45 % NaCl with KCl 20 mEq/L infusion ( Intravenous New Bag/Given 07/25/19 2043)  sodium chloride 0.9 % bolus 1,000 mL (1,000 mLs Intravenous New Bag/Given 07/25/19 2001)  ondansetron (ZOFRAN) injection 4 mg (4 mg Intravenous Given 07/25/19 2018)     Initial Impression / Assessment and Plan / ED Course  I have reviewed the triage vital signs and the nursing notes.  Pertinent labs & imaging results that were available during my care of the patient were reviewed by me and considered in my medical decision making (see chart for details).        9:01 PM Patient awake, alert, receiving fluids. Labs notable for positive pregnancy test, but patient also found to have substantial ketone urea, suggesting dehydration.  With consideration of hyperemesis gravidarum, the patient will require 2 L of fluid resuscitation, first normal saline, second D5, half-normal with potassium.   Patient in no distress, awake,  alert, has received fluids, without complication.  10:37 PM Patient feeling substantially better.  Without other complaints, with newfound pregnancy, and with improvement in spite of initial evidence for substantial dehydration, hyperemesis, the patient was discharged with outpatient follow-up with obstetrics.  Final Clinical Impressions(s) / ED Diagnoses   Final diagnoses:  Nausea and vomiting in pregnancy    ED Discharge Orders         Ordered    ondansetron (ZOFRAN ODT) 4 MG disintegrating tablet  Every 8 hours  PRN     07/25/19 2103           Gerhard Munch, MD 07/25/19 2237

## 2019-07-25 NOTE — ED Triage Notes (Signed)
Pt c/o nausea and vomiting x3 days. Pt reports possible pregnancy. Denies fever, reports inability to hold down food or water, endorses feeling weak.

## 2019-10-23 ENCOUNTER — Other Ambulatory Visit: Payer: Self-pay

## 2019-10-23 ENCOUNTER — Emergency Department (HOSPITAL_BASED_OUTPATIENT_CLINIC_OR_DEPARTMENT_OTHER)
Admission: EM | Admit: 2019-10-23 | Discharge: 2019-10-23 | Disposition: A | Discharge status: Home / Self Care | Payer: Medicaid Other | Attending: Emergency Medicine | Admitting: Emergency Medicine

## 2019-10-23 ENCOUNTER — Encounter (HOSPITAL_BASED_OUTPATIENT_CLINIC_OR_DEPARTMENT_OTHER): Payer: Self-pay

## 2019-10-23 DIAGNOSIS — R197 Diarrhea, unspecified: Secondary | ICD-10-CM

## 2019-10-23 DIAGNOSIS — R52 Pain, unspecified: Secondary | ICD-10-CM

## 2019-10-23 DIAGNOSIS — U071 COVID-19: Secondary | ICD-10-CM | POA: Diagnosis not present

## 2019-10-23 DIAGNOSIS — M7918 Myalgia, other site: Secondary | ICD-10-CM | POA: Diagnosis present

## 2019-10-23 NOTE — ED Provider Notes (Signed)
[facility] EMERGENCY DEPARTMENT Provider Note   CSN: [id number] Arrival date & time: 10/23/19  1507     History Chief Complaint  Patient presents with  . Generalized Body Aches    Tonya Hicks is a 29 y.o. female.  HPI 29 year old African-American female presents to the emergency department today for evaluation of body aches and diarrhea.  Patient reports being exposed to someone with COVID-19 4 days ago.  Patient reports that her symptoms began 2 days ago.  Patient denies any fevers or chills.  Does report some mild sore throat.  No rhinorrhea or cough.  No nausea or vomiting.  No urinary symptoms.  Patient has taken no medications for symptoms prior to arrival.  Patient is concerned about COVID-19 and request testing in the ER.    History reviewed. No pertinent past medical history.  There are no problems to display for this patient.   History reviewed. No pertinent surgical history.   OB History   No obstetric history on file.     No family history on file.  Social History   Tobacco Use  . Smoking status: Never Smoker  . Smokeless tobacco: Never Used  Substance Use Topics  . Alcohol use: Yes    Comment: occ  . Drug use: No    Home Medications Prior to Admission medications   Medication Sig Start Date End Date Taking? Authorizing Provider  cephALEXin (KEFLEX) 500 MG capsule Take 1 capsule (500 mg total) by mouth 2 (two) times daily. 07/07/17   Gwyneth Sprout, MD  metoCLOPramide (REGLAN) 10 MG tablet Take 1 tablet (10 mg total) by mouth every 6 (six) hours as needed for nausea (nausea/headache). 02/09/18   Rolan Bucco, MD  metroNIDAZOLE (FLAGYL) 500 MG tablet Take 1 tablet (500 mg total) by mouth 2 (two) times daily. One po bid x 7 days 10/15/15   Fayrene Helper, PA-C  ondansetron (ZOFRAN ODT) 4 MG disintegrating tablet Take 1 tablet (4 mg total) by mouth every 8 (eight) hours as needed for nausea or vomiting. 07/25/19   Gerhard Munch, MD     Allergies    Patient has no known allergies.  Review of Systems   Review of Systems  Constitutional: Negative for chills and fever.  HENT: Positive for sore throat. Negative for congestion.   Eyes: Negative for discharge.  Respiratory: Negative for cough.   Gastrointestinal: Positive for diarrhea. Negative for nausea and vomiting.  Musculoskeletal: Positive for arthralgias and myalgias.  Skin: Negative for color change.  Neurological: Negative for headaches.  Psychiatric/Behavioral: Negative for confusion.    Physical Exam Updated Vital Signs BP 115/75 (BP Location: Left Arm)   Pulse 75   Temp 98.7 F (37.1 C) (Oral)   Resp 18   Ht 5\' 8"  (1.727 m)   Wt 81.6 kg   LMP 10/13/2019   SpO2 100%   BMI 27.37 kg/m   Physical Exam Vitals and nursing note reviewed.  Constitutional:      General: She is not in acute distress.    Appearance: She is well-developed.  HENT:     Head: Normocephalic and atraumatic.     Right Ear: Tympanic membrane, ear canal and external ear normal.     Left Ear: Tympanic membrane, ear canal and external ear normal.     Nose: Congestion and rhinorrhea present.     Mouth/Throat:     Mouth: Mucous membranes are moist.     Pharynx: Oropharynx is clear. No oropharyngeal exudate or posterior oropharyngeal erythema.  Eyes:     General: No scleral icterus.       Right eye: No discharge.        Left eye: No discharge.  Cardiovascular:     Rate and Rhythm: Normal rate and regular rhythm.     Heart sounds: Normal heart sounds.  Pulmonary:     Effort: Pulmonary effort is normal. No respiratory distress.     Breath sounds: Normal breath sounds. No stridor. No wheezing, rhonchi or rales.  Chest:     Chest wall: No tenderness.  Musculoskeletal:        General: Normal range of motion.     Cervical back: Normal range of motion. No rigidity.  Lymphadenopathy:     Cervical: No cervical adenopathy.  Skin:    General: Skin is warm and dry.      Coloration: Skin is not pale.  Neurological:     Mental Status: She is alert.  Psychiatric:        Behavior: Behavior normal.        Thought Content: Thought content normal.        Judgment: Judgment normal.     ED Results / Procedures / Treatments   Labs (all labs ordered are listed, but only abnormal results are displayed) Labs Reviewed  NOVEL CORONAVIRUS, NAA (HOSP ORDER, SEND-OUT TO REF LAB; TAT 18-24 HRS)    EKG None  Radiology No results found.  Procedures Procedures (including critical care time)  Medications Ordered in ED Medications - No data to display  ED Course  I have reviewed the triage vital signs and the nursing notes.  Pertinent labs & imaging results that were available during my care of the patient were reviewed by me and considered in my medical decision making (see chart for details).    MDM Rules/Calculators/A&P                      29 year old African-American female presents the ER requesting COVID-19 testing for symptoms of generalized body aches, diarrhea.  Patient close contact with a COVID-19 positive person 4 days ago.  Patient well-appearing on exam.  Vital signs reassuring.  Lungs clear to auscultation.  Doubt pneumonia.  Oropharynx is clear no signs of peritonsillar abscess or deep neck infection.  No signs of otitis media.  No nuchal rigidity concerning for meningitis.  COVID-19 test was ordered.  This is pending at this time.  Discussed quarantine precautions at home.  Patient not meeting the criteria for admission at this time.  Encourage symptomatic treatment with anti-inflammatories, Tylenol and fluids at home.    Pt is hemodynamically stable, in NAD, & able to ambulate in the ED. Evaluation does not show pathology that would require ongoing emergent intervention or inpatient treatment. I explained the diagnosis to the patient. Pain has been managed & has no complaints prior to dc. Pt is comfortable with above plan and is stable for  discharge at this time. All questions were answered prior to disposition. Strict return precautions for f/u to the ED were discussed. Encouraged follow up with PCP.  Final Clinical Impression(s) / ED Diagnoses Final diagnoses:  Diarrhea, unspecified type  Generalized body aches    Rx / DC Orders ED Discharge Orders    None       Aaron Edelman 10/23/19 1553    Charlesetta Shanks, MD 10/23/19 1554

## 2019-10-23 NOTE — ED Triage Notes (Signed)
Pt states she wants covid test due to +covid exposure-c/o body aches x 5 days-NAD-steady gait

## 2019-10-23 NOTE — Discharge Instructions (Addendum)
Person Under Monitoring Name: Tonya Hicks  Location: [address] [place] [address]   Infection Prevention Recommendations for Individuals Confirmed to have, or Being Evaluated for, 2019 Novel Coronavirus (COVID-19) Infection Who Receive Care at Home  Individuals who are confirmed to have, or are being evaluated for, COVID-19 should follow the prevention steps below until a healthcare provider or local or state health department says they can return to normal activities.  Stay home except to get medical care You should restrict activities outside your home, except for getting medical care. Do not go to work, school, or public areas, and do not use public transportation or taxis.  Call ahead before visiting your doctor Before your medical appointment, call the healthcare provider and tell them that you have, or are being evaluated for, COVID-19 infection. This will help the healthcare providers office take steps to keep other people from getting infected. Ask your healthcare provider to call the local or state health department.  Monitor your symptoms Seek prompt medical attention if your illness is worsening (e.g., difficulty breathing). Before going to your medical appointment, call the healthcare provider and tell them that you have, or are being evaluated for, COVID-19 infection. Ask your healthcare provider to call the local or state health department.  Wear a facemask You should wear a facemask that covers your nose and mouth when you are in the same room with other people and when you visit a healthcare provider. People who live with or visit you should also wear a facemask while they are in the same room with you.  Separate yourself from other people in your home As much as possible, you should stay in a different room from other people in your home. Also, you should use a separate bathroom, if available.  Avoid sharing household items You should not share  dishes, drinking glasses, cups, eating utensils, towels, bedding, or other items with other people in your home. After using these items, you should wash them thoroughly with soap and water.  Cover your coughs and sneezes Cover your mouth and nose with a tissue when you cough or sneeze, or you can cough or sneeze into your sleeve. Throw used tissues in a lined trash can, and immediately wash your hands with soap and water for at least 20 seconds or use an alcohol-based hand rub.  Wash your [employer] your hands often and thoroughly with soap and water for at least 20 seconds. You can use an alcohol-based hand sanitizer if soap and water are not available and if your hands are not visibly dirty. Avoid touching your eyes, nose, and mouth with unwashed hands.   Prevention Steps for Caregivers and Household Members of Individuals Confirmed to have, or Being Evaluated for, COVID-19 Infection Being Cared for in the Home  If you live with, or provide care at home for, a person confirmed to have, or being evaluated for, COVID-19 infection please follow these guidelines to prevent infection:  Follow healthcare providers instructions Make sure that you understand and can help the patient follow any healthcare provider instructions for all care.  Provide for the patients basic needs You should help the patient with basic needs in the home and provide support for getting groceries, prescriptions, and other personal needs.  Monitor the patients symptoms If they are getting sicker, call his or her medical provider and tell them that the patient has, or is being evaluated for, COVID-19 infection. This will help the healthcare providers office  take steps to keep other people from getting infected. °Ask the healthcare provider to call the local or state health department. ° °Limit the number of people who have contact with the patient °If possible, have only one caregiver for the patient. °Other  household members should stay in another home or place of residence. If this is not possible, they should stay °in another room, or be separated from the patient as much as possible. Use a separate bathroom, if available. °Restrict visitors who do not have an essential need to be in the home. ° °Keep older adults, very young children, and other sick people away from the patient °Keep older adults, very young children, and those who have compromised immune systems or chronic health conditions away from the patient. This includes people with chronic heart, lung, or kidney conditions, diabetes, and cancer. ° °Ensure good ventilation °Make sure that shared spaces in the home have good air flow, such as from an air conditioner or an opened window, °weather permitting. ° °Wash your hands often °Wash your hands often and thoroughly with soap and water for at least 20 seconds. You can use an alcohol based hand sanitizer if soap and water are not available and if your hands are not visibly dirty. °Avoid touching your eyes, nose, and mouth with unwashed hands. °Use disposable paper towels to dry your hands. If not available, use dedicated cloth towels and replace them when they become wet. ° °Wear a facemask and gloves °Wear a disposable facemask at all times in the room and gloves when you touch or have contact with the patient’s blood, body fluids, and/or secretions or excretions, such as sweat, saliva, sputum, nasal mucus, vomit, urine, or feces.  Ensure the mask fits over your nose and mouth tightly, and do not touch it during use. °Throw out disposable facemasks and gloves after using them. Do not reuse. °Wash your hands immediately after removing your facemask and gloves. °If your personal clothing becomes contaminated, carefully remove clothing and launder. Wash your hands after handling contaminated clothing. °Place all used disposable facemasks, gloves, and other waste in a lined container before disposing them with  other household waste. °Remove gloves and wash your hands immediately after handling these items. ° °Do not share dishes, glasses, or other household items with the patient °Avoid sharing household items. You should not share dishes, drinking glasses, cups, eating utensils, towels, bedding, or other items with a patient who is confirmed to have, or being evaluated for, COVID-19 infection. °After the person uses these items, you should wash them thoroughly with soap and water. ° °Wash laundry thoroughly °Immediately remove and wash clothes or bedding that have blood, body fluids, and/or secretions or excretions, such as sweat, saliva, sputum, nasal mucus, vomit, urine, or feces, on them. °Wear gloves when handling laundry from the patient. °Read and follow directions on labels of laundry or clothing items and detergent. In general, wash and dry with the warmest temperatures recommended on the label. ° °Clean all areas the individual has used often °Clean all touchable surfaces, such as counters, tabletops, doorknobs, bathroom fixtures, toilets, phones, keyboards, tablets, and bedside tables, every day. Also, clean any surfaces that may have blood, body fluids, and/or secretions or excretions on them. °Wear gloves when cleaning surfaces the patient has come in contact with. °Use a diluted bleach solution (e.g., dilute bleach with 1 part bleach and 10 parts water) or a household disinfectant with a label that says EPA-registered for coronaviruses. To make a bleach   solution at home, add 1 tablespoon of bleach to 1 quart (4 cups) of water. For a larger supply, add  cup of bleach to 1 gallon (16 cups) of water. Read labels of cleaning products and follow recommendations provided on product labels. Labels contain instructions for safe and effective use of the cleaning product including precautions you should take when applying the product, such as wearing gloves or eye protection and making sure you have good ventilation  during use of the product. Remove gloves and wash hands immediately after cleaning.  Monitor yourself for signs and symptoms of illness Caregivers and household members are considered close contacts, should monitor their health, and will be asked to limit movement outside of the home to the extent possible. Follow the monitoring steps for close contacts listed on the symptom monitoring form.   ? If you have additional questions, contact your local health department or call the epidemiologist on call at [phone] (available 24/7). ? This guidance is subject to change. For the most up-to-date guidance from [facility], please refer to their website: [email or web address]

## 2019-10-26 ENCOUNTER — Telehealth (HOSPITAL_COMMUNITY): Payer: Self-pay

## 2019-10-26 LAB — NOVEL CORONAVIRUS, NAA (HOSP ORDER, SEND-OUT TO REF LAB; TAT 18-24 HRS): SARS-CoV-2, NAA: DETECTED — AB

## 2019-10-27 ENCOUNTER — Telehealth (HOSPITAL_COMMUNITY): Payer: Self-pay

## 2021-07-11 ENCOUNTER — Encounter (HOSPITAL_BASED_OUTPATIENT_CLINIC_OR_DEPARTMENT_OTHER): Payer: Self-pay

## 2021-07-11 ENCOUNTER — Other Ambulatory Visit: Payer: Self-pay

## 2021-07-11 ENCOUNTER — Emergency Department (HOSPITAL_BASED_OUTPATIENT_CLINIC_OR_DEPARTMENT_OTHER)
Admission: EM | Admit: 2021-07-11 | Discharge: 2021-07-11 | Disposition: A | Discharge status: Home / Self Care | Payer: Medicaid Other | Attending: Emergency Medicine | Admitting: Emergency Medicine

## 2021-07-11 DIAGNOSIS — O219 Vomiting of pregnancy, unspecified: Secondary | ICD-10-CM | POA: Diagnosis present

## 2021-07-11 DIAGNOSIS — D72829 Elevated white blood cell count, unspecified: Secondary | ICD-10-CM | POA: Diagnosis not present

## 2021-07-11 LAB — URINALYSIS, MICROSCOPIC (REFLEX)

## 2021-07-11 LAB — HCG, QUANTITATIVE, PREGNANCY: hCG, Beta Chain, Quant, S: 110421 m[IU]/mL — ABNORMAL HIGH (ref <5)

## 2021-07-11 LAB — CBC WITH DIFFERENTIAL/PLATELET
Abs Immature Granulocytes: 0.06 10*3/uL (ref 0.00–0.07)
Basophils Absolute: 0 10*3/uL (ref 0.0–0.1)
Basophils Relative: 0 %
Eosinophils Absolute: 0.1 10*3/uL (ref 0.0–0.5)
Eosinophils Relative: 1 %
HCT: 35.7 % — ABNORMAL LOW (ref 36.0–46.0)
Hemoglobin: 11.7 g/dL — ABNORMAL LOW (ref 12.0–15.0)
Immature Granulocytes: 1 %
Lymphocytes Relative: 18 %
Lymphs Abs: 1.9 10*3/uL (ref 0.7–4.0)
MCH: 23.2 pg — ABNORMAL LOW (ref 26.0–34.0)
MCHC: 32.8 g/dL (ref 30.0–36.0)
MCV: 70.8 fL — ABNORMAL LOW (ref 80.0–100.0)
Monocytes Absolute: 1.2 10*3/uL — ABNORMAL HIGH (ref 0.1–1.0)
Monocytes Relative: 11 %
Neutro Abs: 7.5 10*3/uL (ref 1.7–7.7)
Neutrophils Relative %: 69 %
Platelets: 338 10*3/uL (ref 150–400)
RBC: 5.04 MIL/uL (ref 3.87–5.11)
RDW: 13.3 % (ref 11.5–15.5)
WBC: 10.7 10*3/uL — ABNORMAL HIGH (ref 4.0–10.5)
nRBC: 0 % (ref 0.0–0.2)

## 2021-07-11 LAB — COMPREHENSIVE METABOLIC PANEL
ALT: 14 U/L (ref 0–44)
AST: 13 U/L — ABNORMAL LOW (ref 15–41)
Albumin: 3.9 g/dL (ref 3.5–5.0)
Alkaline Phosphatase: 43 U/L (ref 38–126)
Anion gap: 9 (ref 5–15)
BUN: 8 mg/dL (ref 6–20)
CO2: 25 mmol/L (ref 22–32)
Calcium: 9.4 mg/dL (ref 8.9–10.3)
Chloride: 99 mmol/L (ref 98–111)
Creatinine, Ser: 0.45 mg/dL (ref 0.44–1.00)
GFR, Estimated: >60 mL/min (ref >60)
Glucose, Bld: 85 mg/dL (ref 70–99)
Potassium: 3.9 mmol/L (ref 3.5–5.1)
Sodium: 133 mmol/L — ABNORMAL LOW (ref 135–145)
Total Bilirubin: 0.2 mg/dL — ABNORMAL LOW (ref 0.3–1.2)
Total Protein: 7.4 g/dL (ref 6.5–8.1)

## 2021-07-11 LAB — URINALYSIS, ROUTINE W REFLEX MICROSCOPIC
Bilirubin Urine: NEGATIVE
Glucose, UA: NEGATIVE mg/dL
Hgb urine dipstick: NEGATIVE
Ketones, ur: 80 mg/dL — AB
Nitrite: NEGATIVE
Specific Gravity, Urine: 1.02 (ref 1.005–1.030)
pH: 7.5 (ref 5.0–8.0)

## 2021-07-11 LAB — LIPASE, BLOOD: Lipase: 23 U/L (ref 11–51)

## 2021-07-11 MED ORDER — DIPHENHYDRAMINE HCL 50 MG/ML IJ SOLN
12.5000 mg | Freq: Once | INTRAMUSCULAR | Status: AC
  Administered 2021-07-11: 12.5 mg via INTRAVENOUS
  Filled 2021-07-11: qty 1

## 2021-07-11 MED ORDER — ONDANSETRON HCL 4 MG/2ML IJ SOLN
4.0000 mg | Freq: Once | INTRAMUSCULAR | Status: AC
  Administered 2021-07-11: 4 mg via INTRAVENOUS
  Filled 2021-07-11: qty 2

## 2021-07-11 MED ORDER — SODIUM CHLORIDE 0.9 % IV BOLUS
1000.0000 mL | Freq: Once | INTRAVENOUS | Status: AC
  Administered 2021-07-11: 1000 mL via INTRAVENOUS

## 2021-07-11 MED ORDER — METOCLOPRAMIDE HCL 5 MG/ML IJ SOLN
10.0000 mg | Freq: Once | INTRAMUSCULAR | Status: AC
  Administered 2021-07-11: 10 mg via INTRAVENOUS
  Filled 2021-07-11: qty 2

## 2021-07-11 MED ORDER — PYRIDOXINE HCL 25 MG PO TABS: 25.0000 mg | ORAL_TABLET | Freq: Three times a day (TID) | ORAL | 0 refills | Status: AC

## 2021-07-11 NOTE — ED Provider Notes (Signed)
[facility] EMERGENCY DEPARTMENT Provider Note   CSN: [id number] Arrival date & time: 07/11/21  1658     History Chief Complaint  Patient presents with   Emesis During Pregnancy    Tonya Hicks is a 30 y.o. female presenting for evaluation of nausea and vomiting.  Patient states for the past week she has had persistent nausea and vomiting.  Unable to tolerate p.o.  She was seen at the health department 4 days ago, had a full evaluation including a pelvic.  At the time, she was found to be pregnant.  LMP was around September 20.  She denies fevers, chest pain, shortness of breath, abdominal pain, hematemesis, vaginal bleeding, urinary symptoms, normal bowel movements.  No sick contacts.  No other medical problems.  She is now G2P0.    HPI     No past medical history on file.  There are no problems to display for this patient.   No past surgical history on file.   OB History   No obstetric history on file.     No family history on file.  Social History   Tobacco Use   Smoking status: Never   Smokeless tobacco: Never  Vaping Use   Vaping Use: Never used  Substance Use Topics   Alcohol use: Not Currently    Comment: occ   Drug use: No    Home Medications Prior to Admission medications   Medication Sig Start Date End Date Taking? Authorizing Provider  pyridOXINE (VITAMIN B-6) 25 MG tablet Take 1 tablet (25 mg total) by mouth in the morning, at noon, and at bedtime. 07/11/21  Yes Holmes Hays, PA-C  cephALEXin (KEFLEX) 500 MG capsule Take 1 capsule (500 mg total) by mouth 2 (two) times daily. 07/07/17   Gwyneth Sprout, MD  metoCLOPramide (REGLAN) 10 MG tablet Take 1 tablet (10 mg total) by mouth every 6 (six) hours as needed for nausea (nausea/headache). 02/09/18   Rolan Bucco, MD  metroNIDAZOLE (FLAGYL) 500 MG tablet Take 1 tablet (500 mg total) by mouth 2 (two) times daily. One po bid x 7 days 10/15/15   Fayrene Helper, PA-C  ondansetron (ZOFRAN  ODT) 4 MG disintegrating tablet Take 1 tablet (4 mg total) by mouth every 8 (eight) hours as needed for nausea or vomiting. 07/25/19   Gerhard Munch, MD    Allergies    Patient has no known allergies.  Review of Systems   Review of Systems  Gastrointestinal:  Positive for nausea and vomiting.  All other systems reviewed and are negative.  Physical Exam Updated Vital Signs BP 105/63   Pulse 66   Temp 97.8 F (36.6 C) (Oral)   Resp 16   Ht 5\' 8"  (1.727 m)   Wt 76.7 kg   LMP 05/30/2021 (Approximate)   SpO2 100%   BMI 25.70 kg/m   Physical Exam Vitals and nursing note reviewed.  Constitutional:      General: She is not in acute distress.    Appearance: Normal appearance.     Comments: Nontoxic  HENT:     Head: Normocephalic and atraumatic.  Eyes:     Conjunctiva/sclera: Conjunctivae normal.     Pupils: Pupils are equal, round, and reactive to light.  Cardiovascular:     Rate and Rhythm: Normal rate and regular rhythm.     Pulses: Normal pulses.  Pulmonary:     Effort: Pulmonary effort is normal. No respiratory distress.     Breath sounds: Normal breath sounds. No wheezing.  Comments: Speaking in full sentences.  Clear lung sounds in all fields. Abdominal:     General: There is no distension.     Palpations: Abdomen is soft. There is no mass.     Tenderness: There is no abdominal tenderness. There is no guarding or rebound.     Comments: No TTP of the abdomen.  No rigidity, guarding, distention.  No rebound.  Musculoskeletal:        General: Normal range of motion.     Cervical back: Normal range of motion and neck supple.  Skin:    General: Skin is warm and dry.     Capillary Refill: Capillary refill takes less than 2 seconds.  Neurological:     Mental Status: She is alert and oriented to person, place, and time.  Psychiatric:        Mood and Affect: Mood and affect normal.        Speech: Speech normal.        Behavior: Behavior normal.    ED Results /  Procedures / Treatments   Labs (all labs ordered are listed, but only abnormal results are displayed) Labs Reviewed  CBC WITH DIFFERENTIAL/PLATELET - Abnormal; Notable for the following components:      Result Value   WBC 10.7 (*)    Hemoglobin 11.7 (*)    HCT 35.7 (*)    MCV 70.8 (*)    MCH 23.2 (*)    Monocytes Absolute 1.2 (*)    All other components within normal limits  COMPREHENSIVE METABOLIC PANEL - Abnormal; Notable for the following components:   Sodium 133 (*)    AST 13 (*)    Total Bilirubin 0.2 (*)    All other components within normal limits  URINALYSIS, ROUTINE W REFLEX MICROSCOPIC - Abnormal; Notable for the following components:   APPearance CLOUDY (*)    Ketones, ur 80 (*)    Protein, ur TRACE (*)    Leukocytes,Ua MODERATE (*)    All other components within normal limits  HCG, QUANTITATIVE, PREGNANCY - Abnormal; Notable for the following components:   hCG, Beta Chain, Quant, S 110,421 (*)    All other components within normal limits  URINALYSIS, MICROSCOPIC (REFLEX) - Abnormal; Notable for the following components:   Bacteria, UA MANY (*)    All other components within normal limits  LIPASE, BLOOD    EKG None  Radiology No results found.  Procedures Procedures   Medications Ordered in ED Medications  sodium chloride 0.9 % bolus 1,000 mL (0 mLs Intravenous Stopped 07/11/21 2142)  metoCLOPramide (REGLAN) injection 10 mg (10 mg Intravenous Given 07/11/21 1951)  diphenhydrAMINE (BENADRYL) injection 12.5 mg (12.5 mg Intravenous Given 07/11/21 1951)  ondansetron (ZOFRAN) injection 4 mg (4 mg Intravenous Given 07/11/21 2112)    ED Course  I have reviewed the triage vital signs and the nursing notes.  Pertinent labs & imaging results that were available during my care of the patient were reviewed by me and considered in my medical decision making (see chart for details).    MDM Rules/Calculators/A&P                           Patient presenting for  evaluation of nausea and vomiting in the setting of pregnancy.  On exam, patient appears nontoxic.  She has no fevers or abdominal pain.  Low suspicion for intra-abdominal infection.  Likely hyperemesis in the setting of pregnancy.  However due to her  persistent nausea and vomiting, obtain labs to ensure no electrolyte abnormality, AKI, or significant leukocytosis.  She does not have vaginal bleeding or abdominal pain, low suspicion for ectopic.  Labs interpreted by me, overall reassuring.  Mild leukocytosis of 10.7, likely reactive.  Electrolytes are stable.  Kidney function is normal.  hCG elevated, consistent with pregnancy.  On reevaluation after antiemetics and fluids, patient reports improvement of symptoms, but not complete resolution of nausea.  Will give further antiemetics.  Discussed option of Zofran versus Phenergan, patient would like to try Zofran.  Patient reports significant improvement in nausea.  She is tolerating p.o. without difficulty.  Discussed importance of close follow-up with OB/GYN, resources given.  We will have patient use vitamin B6 to help with morning sickness at home.  At this time, patient appears safe for discharge.  Return precautions given.  Patient states she understands and agrees to plan.  Final Clinical Impression(s) / ED Diagnoses Final diagnoses:  Nausea/vomiting in pregnancy    Rx / DC Orders ED Discharge Orders          Ordered    pyridOXINE (VITAMIN B-6) 25 MG tablet  3 times daily        07/11/21 2233             Alveria Apley, PA-C 07/12/21 0040    Alvira Monday, MD 07/12/21 2300

## 2021-07-11 NOTE — ED Triage Notes (Signed)
Pt c/o vomiting for the past several days. + pregnancy test at health department on Thursday. States unable to tolerate PO.

## 2021-07-11 NOTE — Discharge Instructions (Signed)
You may take the vitamin D6 up to 3 times a day to help minimize nausea and vomiting. Follow-up with your OB/GYN for further evaluation of your pregnancy.  There are 2 clinics listed below that she can try and follow-up with. Interesting well-hydrated water. Eat small, more frequent meals throughout the day. Return to the emergency room for develop high fevers, severe worsening abdominal pain, vaginal bleeding, or any new, worsening, or concerning symptoms

## 2021-12-18 ENCOUNTER — Encounter (HOSPITAL_BASED_OUTPATIENT_CLINIC_OR_DEPARTMENT_OTHER): Payer: Self-pay | Admitting: Emergency Medicine

## 2021-12-18 ENCOUNTER — Emergency Department (HOSPITAL_BASED_OUTPATIENT_CLINIC_OR_DEPARTMENT_OTHER)
Admission: EM | Admit: 2021-12-18 | Discharge: 2021-12-18 | Disposition: A | Discharge status: Home / Self Care | Payer: Medicaid Other | Attending: Emergency Medicine | Admitting: Emergency Medicine

## 2021-12-18 ENCOUNTER — Other Ambulatory Visit: Payer: Self-pay

## 2021-12-18 DIAGNOSIS — B379 Candidiasis, unspecified: Secondary | ICD-10-CM | POA: Diagnosis not present

## 2021-12-18 DIAGNOSIS — B9689 Other specified bacterial agents as the cause of diseases classified elsewhere: Secondary | ICD-10-CM

## 2021-12-18 DIAGNOSIS — N760 Acute vaginitis: Secondary | ICD-10-CM | POA: Diagnosis not present

## 2021-12-18 DIAGNOSIS — L292 Pruritus vulvae: Secondary | ICD-10-CM | POA: Diagnosis present

## 2021-12-18 LAB — URINALYSIS, ROUTINE W REFLEX MICROSCOPIC
Bilirubin Urine: NEGATIVE
Glucose, UA: NEGATIVE mg/dL
Hgb urine dipstick: NEGATIVE
Ketones, ur: NEGATIVE mg/dL
Leukocytes,Ua: NEGATIVE
Nitrite: NEGATIVE
Protein, ur: NEGATIVE mg/dL
Specific Gravity, Urine: 1.015 (ref 1.005–1.030)
pH: 8 (ref 5.0–8.0)

## 2021-12-18 LAB — WET PREP, GENITAL
Sperm: NONE SEEN
Trich, Wet Prep: NONE SEEN
WBC, Wet Prep HPF POC: <10 (ref <10)

## 2021-12-18 LAB — PREGNANCY, URINE: Preg Test, Ur: NEGATIVE

## 2021-12-18 MED ORDER — METRONIDAZOLE 500 MG PO TABS: 500.0000 mg | ORAL_TABLET | Freq: Two times a day (BID) | ORAL | 0 refills | Status: AC

## 2021-12-18 MED ORDER — FLUCONAZOLE 150 MG PO TABS: ORAL_TABLET | ORAL | 0 refills | Status: AC

## 2021-12-18 NOTE — ED Notes (Signed)
Chaperone for EDP for pelvic exam. Pt tolerated well.  ?

## 2021-12-18 NOTE — Discharge Instructions (Addendum)
Your wet prep was positive for both bacterial vaginosis and yeast infection.  You have been given a prescription for Flagyl to treat the bacterial vaginosis, and fluconazole to take 1 tablet every 72 hours for 3 doses for your yeast infection.  We hope you feel better soon! ? ?

## 2021-12-18 NOTE — ED Triage Notes (Signed)
Pt arrives pov, steady gait c/o vaginal irritation and discharge x 3 days. Denies fever ?

## 2021-12-18 NOTE — ED Notes (Signed)
MSE waiver explained. Pt verbalizes understanding. Signature pad not functioning  ?

## 2021-12-19 LAB — GC/CHLAMYDIA PROBE AMP (~~LOC~~) NOT AT ARMC
Chlamydia: NEGATIVE
Comment: NEGATIVE
Comment: NORMAL
Neisseria Gonorrhea: NEGATIVE

## 2021-12-19 NOTE — ED Provider Notes (Signed)
?[facility] EMERGENCY DEPARTMENT ?Provider Note ? ? ?CSN: [id number] ?Arrival date & time: 12/18/21  1628 ? ?  ? ?History ? ?Chief Complaint  ?Patient presents with  ? Vaginal Itching  ? ? ?Tonya Hicks is a 31 y.o. female. ? ?HPI ? ?  ? ?31yo female presents with concern for vaginal discharge and itching.  Present for 3 days. No abdominal pain, pelvic pain, feer, nausea, vomiting, flank pain. Sexually active, sometimes does not use condoms. ? ?History reviewed. No pertinent past medical history.  ? ?Home Medications ?Prior to Admission medications   ?Medication Sig Start Date End Date Taking? Authorizing Provider  ?fluconazole (DIFLUCAN) 150 MG tablet Take 1 tablet once, may repeat every 72 hours for 3 doses 12/18/21  Yes Alvira Monday, MD  ?metroNIDAZOLE (FLAGYL) 500 MG tablet Take 1 tablet (500 mg total) by mouth 2 (two) times daily for 7 days. 12/18/21 12/25/21 Yes Alvira Monday, MD  ?cephALEXin (KEFLEX) 500 MG capsule Take 1 capsule (500 mg total) by mouth 2 (two) times daily. 07/07/17   Gwyneth Sprout, MD  ?metoCLOPramide (REGLAN) 10 MG tablet Take 1 tablet (10 mg total) by mouth every 6 (six) hours as needed for nausea (nausea/headache). 02/09/18   Rolan Bucco, MD  ?ondansetron (ZOFRAN ODT) 4 MG disintegrating tablet Take 1 tablet (4 mg total) by mouth every 8 (eight) hours as needed for nausea or vomiting. 07/25/19   Gerhard Munch, MD  ?pyridOXINE (VITAMIN B-6) 25 MG tablet Take 1 tablet (25 mg total) by mouth in the morning, at noon, and at bedtime. 07/11/21   Caccavale, Sophia, PA-C  ?   ? ?Allergies    ?Patient has no known allergies.   ? ?Review of Systems   ?Review of Systems ? ?Physical Exam ?Updated Vital Signs ?BP 110/80 (BP Location: Right Arm)   Pulse 70   Temp 98.2 ?F (36.8 ?C) (Oral)   Resp 18   Ht 5\' 8"  (1.727 m)   Wt 80.7 kg   LMP 12/02/2021   SpO2 100%   BMI 27.06 kg/m?  ?Physical Exam ?Vitals and nursing note reviewed.  ?Constitutional:   ?   General: She is not  in acute distress. ?   Appearance: Normal appearance. She is not ill-appearing, toxic-appearing or diaphoretic.  ?HENT:  ?   Head: Normocephalic.  ?Eyes:  ?   Conjunctiva/sclera: Conjunctivae normal.  ?Cardiovascular:  ?   Rate and Rhythm: Normal rate and regular rhythm.  ?   Pulses: Normal pulses.  ?Pulmonary:  ?   Effort: Pulmonary effort is normal. No respiratory distress.  ?Abdominal:  ?   General: There is no distension.  ?   Tenderness: There is no abdominal tenderness. There is no right CVA tenderness or left CVA tenderness.  ?   Hernia: No hernia is present.  ?Genitourinary: ?   Vagina: Vaginal discharge present.  ?   Cervix: Discharge present. No cervical motion tenderness.  ?   Uterus: Not tender.   ?   Adnexa:     ?   Right: Tenderness: mild.     ?   Left: No tenderness.    ?Musculoskeletal:     ?   General: No deformity or signs of injury.  ?   Cervical back: No rigidity.  ?Skin: ?   General: Skin is warm and dry.  ?   Coloration: Skin is not jaundiced or pale.  ?Neurological:  ?   General: No focal deficit present.  ?   Mental Status: She is  alert and oriented to person, place, and time.  ? ? ?ED Results / Procedures / Treatments   ?Labs ?(all labs ordered are listed, but only abnormal results are displayed) ?Labs Reviewed  ?WET PREP, GENITAL - Abnormal; Notable for the following components:  ?    Result Value  ? Yeast Wet Prep HPF POC PRESENT (*)   ? Clue Cells Wet Prep HPF POC PRESENT (*)   ? All other components within normal limits  ?PREGNANCY, URINE  ?URINALYSIS, ROUTINE W REFLEX MICROSCOPIC  ?GC/CHLAMYDIA PROBE AMP ([place]) NOT AT [facility]  ? ? ?EKG ?None ? ?Radiology ?No results found. ? ?Procedures ?Procedures  ? ? ?Medications Ordered in ED ?Medications - No data to display ? ?ED Course/ Medical Decision Making/ A&P ?  ?                        ?Medical Decision Making ?Amount and/or Complexity of Data Reviewed ?Labs: ordered. ? ?Risk ?Prescription drug management. ? ? ?31yo female presents  with concern for vaginal discharge and itching.  No abdominal pain on history, no fever. Negative pregnancy test, UA without infection.  Mild right adnexal tenderness, exam/hx overall not consistent with TOA/torsion/PID.  Wet prep with BV and yeast infection likely etiology of symptoms. Given flagyl, fluconazole.  Recommend PCOP/GYN follow up. Patient discharged in stable condition with understanding of reasons to return.  ? ? ? ? ? ? ? ?Final Clinical Impression(s) / ED Diagnoses ?Final diagnoses:  ?Yeast infection  ?Bacterial vaginosis  ? ? ?Rx / DC Orders ?ED Discharge Orders   ? ?      Ordered  ?  metroNIDAZOLE (FLAGYL) 500 MG tablet  2 times daily       ? 12/18/21 2012  ?  fluconazole (DIFLUCAN) 150 MG tablet       ? 12/18/21 2012  ? ?  ?  ? ?  ? ? ?  ?Alvira Monday, MD ?12/19/21 2303 ? ?

## 2022-04-09 ENCOUNTER — Emergency Department (HOSPITAL_BASED_OUTPATIENT_CLINIC_OR_DEPARTMENT_OTHER)
Admission: EM | Admit: 2022-04-09 | Discharge: 2022-04-09 | Disposition: A | Discharge status: Home / Self Care | Payer: Medicaid Other | Attending: Emergency Medicine | Admitting: Emergency Medicine

## 2022-04-09 ENCOUNTER — Other Ambulatory Visit: Payer: Self-pay

## 2022-04-09 ENCOUNTER — Emergency Department (HOSPITAL_BASED_OUTPATIENT_CLINIC_OR_DEPARTMENT_OTHER): Payer: Medicaid Other

## 2022-04-09 ENCOUNTER — Encounter (HOSPITAL_BASED_OUTPATIENT_CLINIC_OR_DEPARTMENT_OTHER): Payer: Self-pay

## 2022-04-09 DIAGNOSIS — O219 Vomiting of pregnancy, unspecified: Secondary | ICD-10-CM | POA: Insufficient documentation

## 2022-04-09 DIAGNOSIS — O26891 Other specified pregnancy related conditions, first trimester: Secondary | ICD-10-CM | POA: Diagnosis not present

## 2022-04-09 DIAGNOSIS — R1032 Left lower quadrant pain: Secondary | ICD-10-CM | POA: Insufficient documentation

## 2022-04-09 DIAGNOSIS — Z3A01 Less than 8 weeks gestation of pregnancy: Secondary | ICD-10-CM | POA: Diagnosis not present

## 2022-04-09 DIAGNOSIS — R110 Nausea: Secondary | ICD-10-CM

## 2022-04-09 LAB — URINALYSIS, ROUTINE W REFLEX MICROSCOPIC
Bilirubin Urine: NEGATIVE
Glucose, UA: NEGATIVE mg/dL
Hgb urine dipstick: NEGATIVE
Ketones, ur: NEGATIVE mg/dL
Leukocytes,Ua: NEGATIVE
Nitrite: NEGATIVE
Protein, ur: 30 mg/dL — AB
Specific Gravity, Urine: 1.02 (ref 1.005–1.030)
pH: 7.5 (ref 5.0–8.0)

## 2022-04-09 LAB — URINALYSIS, MICROSCOPIC (REFLEX)

## 2022-04-09 LAB — HCG, QUANTITATIVE, PREGNANCY: hCG, Beta Chain, Quant, S: 17380 m[IU]/mL — ABNORMAL HIGH (ref <5)

## 2022-04-09 LAB — PREGNANCY, URINE: Preg Test, Ur: POSITIVE — AB

## 2022-04-09 MED ORDER — ONDANSETRON HCL 4 MG PO TABS: 4.0000 mg | ORAL_TABLET | Freq: Three times a day (TID) | ORAL | 0 refills | Status: AC | PRN

## 2022-04-09 NOTE — Discharge Instructions (Addendum)
It was a pleasure taking care of you today!   Your hCG level (hormone detected in pregnancy) was elevated today in the ED.  Your ultrasound showed a viable pregnancy measuring at approximately 5 weeks and 6 days.  It is important that you start taking over-the-counter prenatal vitamins.  Attached is information for the on-call OB/GYN specialist, call and set up a follow-up appoint regarding today's ED visit.  You will be sent a prescription for Zofran, you may take as directed.  You may also use over-the-counter ginger chews to aid with your nausea.  You may follow-up with your primary care provider as needed.  Return to the emergency department experience increasing/worsening abdominal pain, vomiting, fever, worsening symptoms.

## 2022-04-09 NOTE — ED Triage Notes (Signed)
States has been feeling nauseous and feverish the past few days. Has had a positive and negative pregnancy test, wants to be checked for pregnancy  G2P0  1 Miscarriage, 1 abortion

## 2022-04-09 NOTE — ED Provider Notes (Signed)
[facility] EMERGENCY DEPARTMENT Provider Note   CSN: [id number] Arrival date & time: 04/09/22  1618     History  Chief Complaint  Patient presents with   Nausea    Tonya Hicks is a 31 y.o. female who presents to the emergency department with concerns for nausea onset 2-3 days.  Notes that she has had a positive as well as a negative pregnancy test 2 days ago and wants to be evaluated for pregnancy.  Has associated left lower quadrant abdominal pain, nausea, vomiting.  No meds tried prior to arrival.  Denies fever. Last menstrual period was 02/26/2022.   The history is provided by the patient. No language interpreter was used.       Home Medications Prior to Admission medications   Medication Sig Start Date End Date Taking? Authorizing Provider  cephALEXin (KEFLEX) 500 MG capsule Take 1 capsule (500 mg total) by mouth 2 (two) times daily. 07/07/17   Gwyneth Sprout, MD  fluconazole (DIFLUCAN) 150 MG tablet Take 1 tablet once, may repeat every 72 hours for 3 doses 12/18/21   Alvira Monday, MD  metoCLOPramide (REGLAN) 10 MG tablet Take 1 tablet (10 mg total) by mouth every 6 (six) hours as needed for nausea (nausea/headache). 02/09/18   Rolan Bucco, MD  ondansetron (ZOFRAN ODT) 4 MG disintegrating tablet Take 1 tablet (4 mg total) by mouth every 8 (eight) hours as needed for nausea or vomiting. 07/25/19   Gerhard Munch, MD  pyridOXINE (VITAMIN B-6) 25 MG tablet Take 1 tablet (25 mg total) by mouth in the morning, at noon, and at bedtime. 07/11/21   Caccavale, Sophia, PA-C      Allergies    Patient has no known allergies.    Review of Systems   Review of Systems  Constitutional:  Negative for fever.  Gastrointestinal:  Positive for abdominal pain (LLQ), nausea and vomiting.  All other systems reviewed and are negative.   Physical Exam Updated Vital Signs BP 131/77   Pulse 68   Temp 97.7 F (36.5 C) (Oral)   Resp 17   Wt 85 kg   LMP 02/26/2022  (Approximate)   SpO2 100%   BMI 28.49 kg/m  Physical Exam Vitals and nursing note reviewed.  Constitutional:      General: She is not in acute distress.    Appearance: She is not diaphoretic.  HENT:     Head: Normocephalic and atraumatic.     Mouth/Throat:     Pharynx: No oropharyngeal exudate.  Eyes:     General: No scleral icterus.    Conjunctiva/sclera: Conjunctivae normal.  Cardiovascular:     Rate and Rhythm: Normal rate and regular rhythm.     Pulses: Normal pulses.     Heart sounds: Normal heart sounds.  Pulmonary:     Effort: Pulmonary effort is normal. No respiratory distress.     Breath sounds: Normal breath sounds. No wheezing.  Abdominal:     General: Bowel sounds are normal.     Palpations: Abdomen is soft. There is no mass.     Tenderness: There is no abdominal tenderness. There is no guarding or rebound.  Musculoskeletal:        General: Normal range of motion.     Cervical back: Normal range of motion and neck supple.  Skin:    General: Skin is warm and dry.  Neurological:     Mental Status: She is alert.  Psychiatric:        Behavior: Behavior normal.  ED Results / Procedures / Treatments   Labs (all labs ordered are listed, but only abnormal results are displayed) Labs Reviewed  PREGNANCY, URINE - Abnormal; Notable for the following components:      Result Value   Preg Test, Ur POSITIVE (*)    All other components within normal limits  URINALYSIS, ROUTINE W REFLEX MICROSCOPIC - Abnormal; Notable for the following components:   Protein, ur 30 (*)    All other components within normal limits  URINALYSIS, MICROSCOPIC (REFLEX) - Abnormal; Notable for the following components:   Bacteria, UA RARE (*)    All other components within normal limits    EKG None  Radiology No results found.  Procedures Procedures    Medications Ordered in ED Medications - No data to display  ED Course/ Medical Decision Making/ A&P Clinical Course as of  04/09/22 2307  [facility] Apr 09, 2022  2016 Preg Test, Ur(!): POSITIVE [SB]  2307 Patient reevaluated and resting comfortably on stretcher.  Discussed with patient ultrasound findings.  Discussed discharge treatment plan.  Answered all available questions.  Patient appears safe for discharge at this time. [SB]    Clinical Course User Index [SB] Tasmia Blumer A, PA-C                           Medical Decision Making Amount and/or Complexity of Data Reviewed Labs: ordered. Decision-making details documented in ED Course. Radiology: ordered.  Risk Prescription drug management.   Pt presents with nausea onset 2-3 days.  Notes that she has also had a positive pregnancy test 2 days ago.  Went to be evaluated for pregnancy at this time.  No meds prior to arrival.  Last menstrual period was 02/26/2022.  Patient does not have OB/GYN at this time.  Patient afebrile.  On exam patient without acute cardiovascular, respiratory, abdominal exam findings.  Differential diagnosis includes ectopic pregnancy, IUP, diverticulitis.   Labs:  I ordered, and personally interpreted labs.  The pertinent results include:   Urine pregnancy positive. hCG quantitative elevated at 17,380. Urinalysis unremarkable  Imaging: I ordered imaging studies including ultrasound OB I independently visualized and interpreted imaging which showed: Single portable intrauterine pregnancy.  Small subchorionic hemorrhage noted I agree with the radiologist interpretation  Disposition: Presentation suspicious for IUP.  Doubt ectopic pregnancy at this time. After consideration of the diagnostic results and the patients response to treatment, I feel that the patient would benefit from Discharge home.  Will be sent with information for on-call OB/GYN specialist and instructed to follow-up.  Patient also instructed to start prenatal vitamins at this time.  Will be sent a prescription for Zofran.  Supportive care measures and strict return  precautions discussed with patient at bedside. Pt acknowledges and verbalizes understanding. Pt appears safe for discharge. Follow up as indicated in discharge paperwork.    This chart was dictated using voice recognition software, Dragon. Despite the best efforts of this provider to proofread and correct errors, errors may still occur which can change documentation meaning.  Final Clinical Impression(s) / ED Diagnoses Final diagnoses:  Less than [redacted] weeks gestation of pregnancy  Nausea    Rx / DC Orders ED Discharge Orders          Ordered    ondansetron (ZOFRAN) 4 MG tablet  Every 8 hours PRN        04/09/22 2310              Lonn Im,  Celena Lanius A, PA-C 04/09/22 2352    Jacalyn Lefevre, MD 04/12/22 [phone]
